# Patient Record
Sex: Female | Born: 1955 | Race: White | Hispanic: No
Health system: Southern US, Community
[De-identification: ages and names within clinical notes are randomized; demographics above are authoritative.]

---

## 2006-07-12 ENCOUNTER — Encounter: Payer: Self-pay | Admitting: Family Medicine

## 2007-02-18 LAB — CONVERTED CEMR LAB: Pap Smear: NORMAL

## 2007-08-01 ENCOUNTER — Encounter: Payer: Self-pay | Admitting: Family Medicine

## 2007-11-27 ENCOUNTER — Emergency Department (HOSPITAL_COMMUNITY): Admission: EM | Admit: 2007-11-27 | Discharge: 2007-11-27 | Payer: Self-pay | Admitting: Family Medicine

## 2008-01-23 ENCOUNTER — Encounter: Payer: Self-pay | Admitting: Family Medicine

## 2008-02-07 ENCOUNTER — Ambulatory Visit: Payer: Self-pay | Admitting: Family Medicine

## 2008-02-07 DIAGNOSIS — G43009 Migraine without aura, not intractable, without status migrainosus: Secondary | ICD-10-CM | POA: Insufficient documentation

## 2008-02-07 DIAGNOSIS — I10 Essential (primary) hypertension: Secondary | ICD-10-CM | POA: Insufficient documentation

## 2008-02-07 DIAGNOSIS — G4700 Insomnia, unspecified: Secondary | ICD-10-CM | POA: Insufficient documentation

## 2008-02-07 DIAGNOSIS — M79609 Pain in unspecified limb: Secondary | ICD-10-CM | POA: Insufficient documentation

## 2008-02-07 DIAGNOSIS — M5030 Other cervical disc degeneration, unspecified cervical region: Secondary | ICD-10-CM

## 2008-02-07 DIAGNOSIS — I2510 Atherosclerotic heart disease of native coronary artery without angina pectoris: Secondary | ICD-10-CM | POA: Insufficient documentation

## 2008-02-07 DIAGNOSIS — K279 Peptic ulcer, site unspecified, unspecified as acute or chronic, without hemorrhage or perforation: Secondary | ICD-10-CM | POA: Insufficient documentation

## 2008-02-07 DIAGNOSIS — J4489 Other specified chronic obstructive pulmonary disease: Secondary | ICD-10-CM | POA: Insufficient documentation

## 2008-02-07 DIAGNOSIS — M546 Pain in thoracic spine: Secondary | ICD-10-CM

## 2008-02-07 DIAGNOSIS — F411 Generalized anxiety disorder: Secondary | ICD-10-CM | POA: Insufficient documentation

## 2008-02-07 DIAGNOSIS — E785 Hyperlipidemia, unspecified: Secondary | ICD-10-CM

## 2008-02-07 DIAGNOSIS — Z87442 Personal history of urinary calculi: Secondary | ICD-10-CM

## 2008-02-07 DIAGNOSIS — J309 Allergic rhinitis, unspecified: Secondary | ICD-10-CM | POA: Insufficient documentation

## 2008-02-07 DIAGNOSIS — J449 Chronic obstructive pulmonary disease, unspecified: Secondary | ICD-10-CM

## 2008-02-09 ENCOUNTER — Encounter: Payer: Self-pay | Admitting: Family Medicine

## 2008-02-21 ENCOUNTER — Telehealth: Payer: Self-pay | Admitting: Family Medicine

## 2008-03-20 LAB — CONVERTED CEMR LAB: Pap Smear: NORMAL

## 2008-03-25 ENCOUNTER — Telehealth: Payer: Self-pay | Admitting: Family Medicine

## 2008-03-31 ENCOUNTER — Other Ambulatory Visit: Admission: RE | Admit: 2008-03-31 | Discharge: 2008-03-31 | Payer: Self-pay | Admitting: Family Medicine

## 2008-04-01 ENCOUNTER — Encounter: Payer: Self-pay | Admitting: Family Medicine

## 2008-04-01 ENCOUNTER — Ambulatory Visit: Payer: Self-pay | Admitting: Family Medicine

## 2008-04-09 LAB — CONVERTED CEMR LAB
ALT: 12 units/L (ref 0–35)
AST: 20 units/L (ref 0–37)
Albumin: 4 g/dL (ref 3.5–5.2)
Alkaline Phosphatase: 109 units/L (ref 39–117)
BUN: 11 mg/dL (ref 6–23)
Bilirubin, Direct: 0.1 mg/dL (ref 0.0–0.3)
CO2: 25 meq/L (ref 19–32)
Calcium: 9.6 mg/dL (ref 8.4–10.5)
Chloride: 106 meq/L (ref 96–112)
Cholesterol: 170 mg/dL (ref 0–200)
Creatinine, Ser: 0.8 mg/dL (ref 0.4–1.2)
GFR calc Af Amer: 97 mL/min
GFR calc non Af Amer: 80 mL/min
Glucose, Bld: 63 mg/dL — ABNORMAL LOW (ref 70–99)
HDL: 79.3 mg/dL (ref >39.0)
LDL Cholesterol: 75 mg/dL (ref 0–99)
Potassium: 4.2 meq/L (ref 3.5–5.1)
Sodium: 138 meq/L (ref 135–145)
Total Bilirubin: 0.7 mg/dL (ref 0.3–1.2)
Total CHOL/HDL Ratio: 2.1
Total Protein: 7 g/dL (ref 6.0–8.3)
Triglycerides: 78 mg/dL (ref 0–149)
VLDL: 16 mg/dL (ref 0–40)

## 2008-04-23 ENCOUNTER — Telehealth: Payer: Self-pay | Admitting: Family Medicine

## 2008-04-30 ENCOUNTER — Encounter: Payer: Self-pay | Admitting: Family Medicine

## 2008-05-14 ENCOUNTER — Ambulatory Visit: Payer: Self-pay | Admitting: Family Medicine

## 2008-05-14 DIAGNOSIS — M810 Age-related osteoporosis without current pathological fracture: Secondary | ICD-10-CM | POA: Insufficient documentation

## 2008-05-16 LAB — CONVERTED CEMR LAB: Vit D, 1,25-Dihydroxy: 53 (ref 30–89)

## 2008-06-17 ENCOUNTER — Ambulatory Visit: Payer: Self-pay | Admitting: Family Medicine

## 2008-06-26 ENCOUNTER — Telehealth: Payer: Self-pay | Admitting: Family Medicine

## 2008-07-16 ENCOUNTER — Telehealth: Payer: Self-pay | Admitting: Family Medicine

## 2008-07-29 ENCOUNTER — Telehealth: Payer: Self-pay | Admitting: Family Medicine

## 2008-08-22 ENCOUNTER — Ambulatory Visit: Payer: Self-pay | Admitting: Family Medicine

## 2008-08-22 DIAGNOSIS — J441 Chronic obstructive pulmonary disease with (acute) exacerbation: Secondary | ICD-10-CM

## 2008-08-29 ENCOUNTER — Ambulatory Visit: Payer: Self-pay | Admitting: Family Medicine

## 2008-08-30 ENCOUNTER — Telehealth: Payer: Self-pay | Admitting: Family Medicine

## 2008-09-11 ENCOUNTER — Telehealth: Payer: Self-pay | Admitting: Family Medicine

## 2008-09-30 ENCOUNTER — Ambulatory Visit: Payer: Self-pay | Admitting: Family Medicine

## 2008-10-04 ENCOUNTER — Telehealth (INDEPENDENT_AMBULATORY_CARE_PROVIDER_SITE_OTHER): Payer: Self-pay | Admitting: *Deleted

## 2008-10-21 ENCOUNTER — Telehealth: Payer: Self-pay | Admitting: Family Medicine

## 2008-10-31 ENCOUNTER — Telehealth: Payer: Self-pay | Admitting: Family Medicine

## 2008-12-03 ENCOUNTER — Telehealth: Payer: Self-pay | Admitting: Family Medicine

## 2009-01-02 ENCOUNTER — Telehealth: Payer: Self-pay | Admitting: Family Medicine

## 2009-01-03 ENCOUNTER — Telehealth: Payer: Self-pay | Admitting: Family Medicine

## 2009-01-09 ENCOUNTER — Telehealth: Payer: Self-pay | Admitting: Family Medicine

## 2009-01-27 ENCOUNTER — Telehealth: Payer: Self-pay | Admitting: Family Medicine

## 2009-02-03 ENCOUNTER — Telehealth: Payer: Self-pay | Admitting: Family Medicine

## 2009-02-24 ENCOUNTER — Telehealth: Payer: Self-pay | Admitting: Family Medicine

## 2009-02-26 ENCOUNTER — Encounter: Payer: Self-pay | Admitting: Family Medicine

## 2009-03-04 ENCOUNTER — Telehealth: Payer: Self-pay | Admitting: Family Medicine

## 2009-04-03 ENCOUNTER — Telehealth (INDEPENDENT_AMBULATORY_CARE_PROVIDER_SITE_OTHER): Payer: Self-pay | Admitting: *Deleted

## 2009-04-15 ENCOUNTER — Ambulatory Visit: Payer: Self-pay | Admitting: Family Medicine

## 2009-04-15 DIAGNOSIS — G43909 Migraine, unspecified, not intractable, without status migrainosus: Secondary | ICD-10-CM | POA: Insufficient documentation

## 2009-05-07 ENCOUNTER — Telehealth: Payer: Self-pay | Admitting: Family Medicine

## 2009-05-09 ENCOUNTER — Ambulatory Visit: Payer: Self-pay | Admitting: Family Medicine

## 2009-05-09 ENCOUNTER — Encounter (INDEPENDENT_AMBULATORY_CARE_PROVIDER_SITE_OTHER): Payer: Self-pay | Admitting: *Deleted

## 2009-05-12 ENCOUNTER — Ambulatory Visit: Payer: Self-pay | Admitting: Family Medicine

## 2009-05-12 ENCOUNTER — Telehealth: Payer: Self-pay | Admitting: Family Medicine

## 2009-05-12 LAB — CONVERTED CEMR LAB
Cholesterol, target level: 200 mg/dL
HDL goal, serum: 40 mg/dL
LDL Goal: 70 mg/dL

## 2009-05-15 ENCOUNTER — Ambulatory Visit: Payer: Self-pay | Admitting: Family Medicine

## 2009-05-20 ENCOUNTER — Ambulatory Visit: Payer: Self-pay | Admitting: Family Medicine

## 2009-05-20 DIAGNOSIS — E739 Lactose intolerance, unspecified: Secondary | ICD-10-CM

## 2009-05-20 DIAGNOSIS — K219 Gastro-esophageal reflux disease without esophagitis: Secondary | ICD-10-CM

## 2009-05-20 LAB — CONVERTED CEMR LAB
ALT: 22 units/L (ref 0–35)
AST: 33 units/L (ref 0–37)
Albumin: 3.8 g/dL (ref 3.5–5.2)
BUN: 16 mg/dL (ref 6–23)
CO2: 27 meq/L (ref 19–32)
Chloride: 105 meq/L (ref 96–112)
Glucose, Bld: 142 mg/dL — ABNORMAL HIGH (ref 70–99)
Potassium: 3.8 meq/L (ref 3.5–5.1)

## 2009-05-21 LAB — CONVERTED CEMR LAB: Cholesterol: 158 mg/dL (ref 0–200)

## 2009-05-27 ENCOUNTER — Observation Stay (HOSPITAL_COMMUNITY): Admission: EM | Admit: 2009-05-27 | Discharge: 2009-05-29 | Payer: Self-pay | Admitting: Emergency Medicine

## 2009-05-27 ENCOUNTER — Ambulatory Visit: Payer: Self-pay | Admitting: Family Medicine

## 2009-05-27 DIAGNOSIS — J189 Pneumonia, unspecified organism: Secondary | ICD-10-CM

## 2009-05-29 ENCOUNTER — Encounter (INDEPENDENT_AMBULATORY_CARE_PROVIDER_SITE_OTHER): Payer: Self-pay | Admitting: *Deleted

## 2009-05-29 ENCOUNTER — Telehealth: Payer: Self-pay | Admitting: Family Medicine

## 2009-06-03 ENCOUNTER — Ambulatory Visit: Payer: Self-pay | Admitting: Family Medicine

## 2009-06-03 ENCOUNTER — Encounter (INDEPENDENT_AMBULATORY_CARE_PROVIDER_SITE_OTHER): Payer: Self-pay | Admitting: *Deleted

## 2009-06-10 ENCOUNTER — Encounter: Payer: Self-pay | Admitting: Family Medicine

## 2009-06-18 ENCOUNTER — Ambulatory Visit: Payer: Self-pay | Admitting: Family Medicine

## 2009-06-18 LAB — CONVERTED CEMR LAB
BUN: 14 mg/dL (ref 6–23)
Chloride: 108 meq/L (ref 96–112)
Glucose, Bld: 106 mg/dL — ABNORMAL HIGH (ref 70–99)
Hgb A1c MFr Bld: 5.9 % (ref 4.6–6.5)
Microalb Creat Ratio: 25.4 mg/g (ref 0.0–30.0)
Microalb, Ur: 0.3 mg/dL (ref 0.0–1.9)
Potassium: 4.4 meq/L (ref 3.5–5.1)

## 2009-06-25 ENCOUNTER — Ambulatory Visit: Payer: Self-pay | Admitting: Family Medicine

## 2009-06-30 ENCOUNTER — Telehealth: Payer: Self-pay | Admitting: Family Medicine

## 2009-07-10 ENCOUNTER — Telehealth: Payer: Self-pay | Admitting: Family Medicine

## 2009-07-31 ENCOUNTER — Telehealth: Payer: Self-pay | Admitting: Family Medicine

## 2009-08-08 ENCOUNTER — Telehealth: Payer: Self-pay | Admitting: Family Medicine

## 2009-09-25 ENCOUNTER — Telehealth: Payer: Self-pay | Admitting: Family Medicine

## 2009-10-30 ENCOUNTER — Ambulatory Visit: Payer: Self-pay | Admitting: Family Medicine

## 2009-10-30 DIAGNOSIS — M545 Low back pain: Secondary | ICD-10-CM

## 2009-11-11 ENCOUNTER — Telehealth: Payer: Self-pay | Admitting: Family Medicine

## 2009-11-27 ENCOUNTER — Encounter: Payer: Self-pay | Admitting: Family Medicine

## 2009-11-27 ENCOUNTER — Ambulatory Visit: Payer: Self-pay | Admitting: Pain Medicine

## 2009-12-03 ENCOUNTER — Encounter: Payer: Self-pay | Admitting: Family Medicine

## 2009-12-03 ENCOUNTER — Ambulatory Visit: Payer: Self-pay | Admitting: Pain Medicine

## 2009-12-31 ENCOUNTER — Telehealth: Payer: Self-pay | Admitting: Family Medicine

## 2010-01-06 ENCOUNTER — Ambulatory Visit: Payer: Self-pay | Admitting: Pain Medicine

## 2010-01-06 ENCOUNTER — Encounter: Payer: Self-pay | Admitting: Family Medicine

## 2010-01-12 ENCOUNTER — Ambulatory Visit: Payer: Self-pay | Admitting: Pain Medicine

## 2010-01-12 ENCOUNTER — Encounter: Payer: Self-pay | Admitting: Family Medicine

## 2010-01-13 ENCOUNTER — Ambulatory Visit: Payer: Self-pay | Admitting: Pain Medicine

## 2010-02-03 ENCOUNTER — Telehealth: Payer: Self-pay | Admitting: Family Medicine

## 2010-02-12 ENCOUNTER — Telehealth: Payer: Self-pay | Admitting: Family Medicine

## 2010-02-12 ENCOUNTER — Encounter: Payer: Self-pay | Admitting: Family Medicine

## 2010-02-12 ENCOUNTER — Ambulatory Visit: Payer: Self-pay | Admitting: Pain Medicine

## 2010-02-17 ENCOUNTER — Ambulatory Visit: Payer: Self-pay | Admitting: Family Medicine

## 2010-02-17 DIAGNOSIS — K5909 Other constipation: Secondary | ICD-10-CM

## 2010-02-18 ENCOUNTER — Ambulatory Visit: Payer: Self-pay | Admitting: Pain Medicine

## 2010-02-18 ENCOUNTER — Encounter: Payer: Self-pay | Admitting: Family Medicine

## 2010-03-18 ENCOUNTER — Encounter: Payer: Self-pay | Admitting: Family Medicine

## 2010-03-18 ENCOUNTER — Ambulatory Visit: Payer: Self-pay | Admitting: Pain Medicine

## 2010-03-24 ENCOUNTER — Ambulatory Visit: Payer: Self-pay | Admitting: Family Medicine

## 2010-03-25 IMAGING — CR DG CHEST 2V
2 series · 2 of 2 positions shown · non-contrast
Comparison: None

CLINICAL DATA: Cough, pain

CHEST - 2 VIEW

[w chest pa]
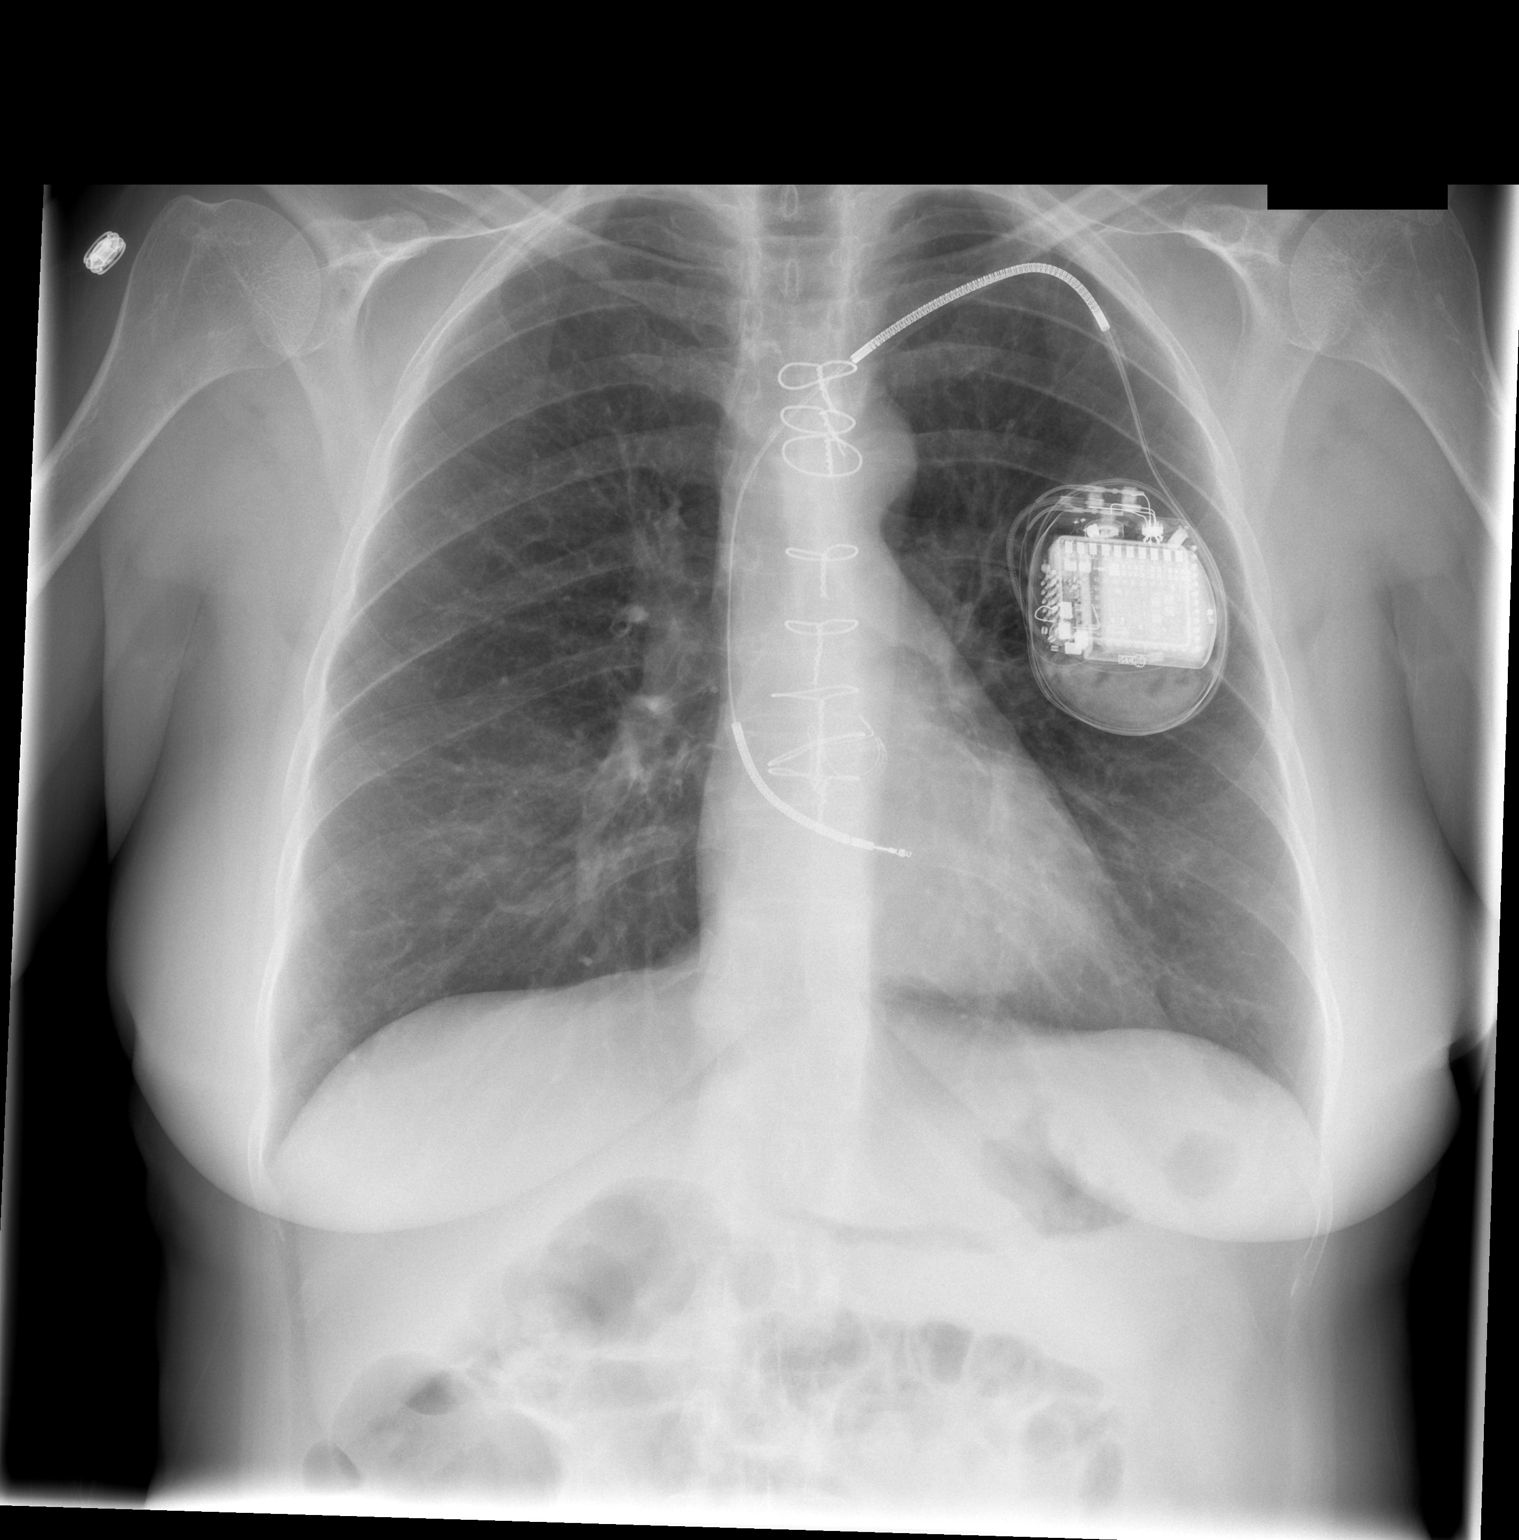

[w chest lat]
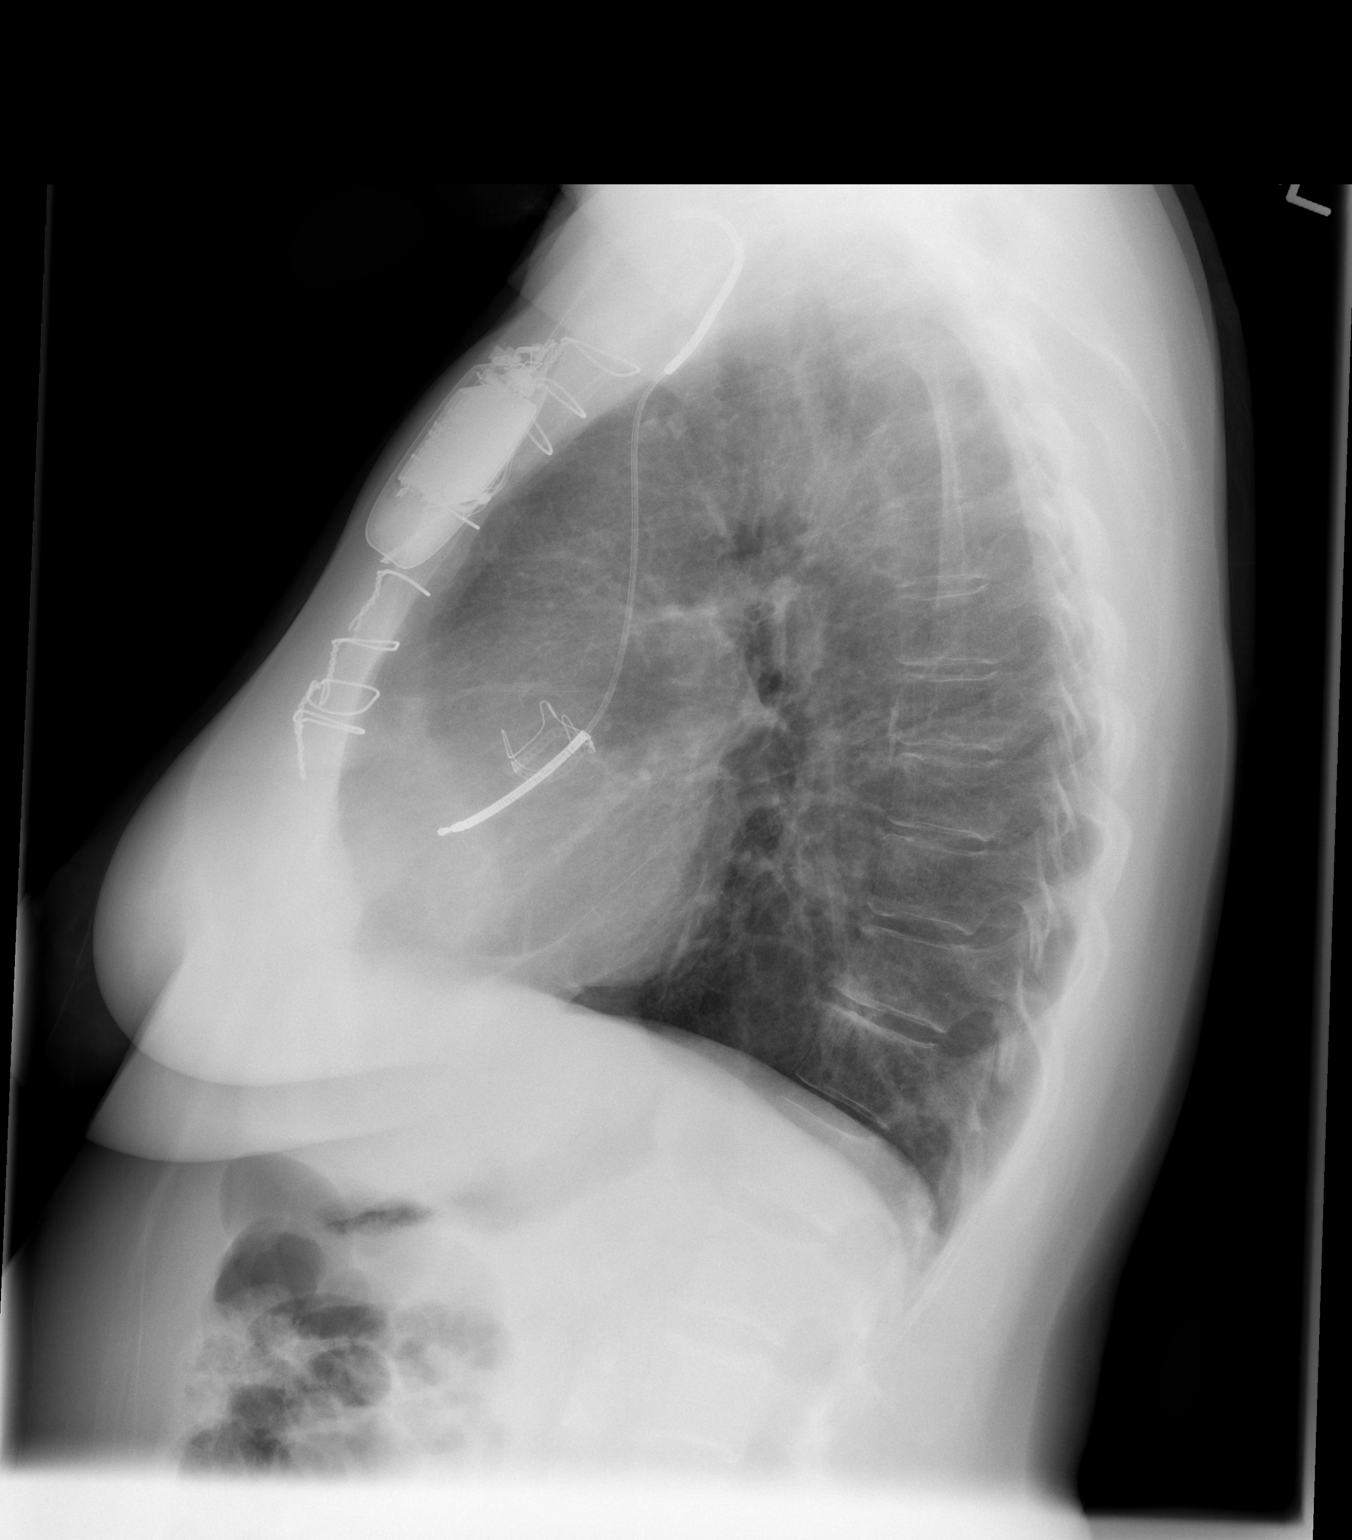

[2 of 2 positions shown; findings below may reference images not displayed]

FINDINGS: The cardiac silhouette, mediastinum, pulmonary
vasculature are within normal limits.  Both lungs are clear.  The
single pacemaker lead  overlies the anterior mid heart.  An aortic
valve prosthesis is present.  The visualized upper abdomen and
osseous structures are unremarkable.
IMPRESSION: There is no evidence of acute cardiac or pulmonary process.

## 2010-04-01 ENCOUNTER — Encounter: Payer: Self-pay | Admitting: Family Medicine

## 2010-04-01 ENCOUNTER — Ambulatory Visit: Payer: Self-pay | Admitting: Pain Medicine

## 2010-04-08 ENCOUNTER — Telehealth: Payer: Self-pay | Admitting: Family Medicine

## 2010-04-22 ENCOUNTER — Telehealth: Payer: Self-pay | Admitting: Family Medicine

## 2010-04-28 ENCOUNTER — Encounter: Payer: Self-pay | Admitting: Family Medicine

## 2010-04-28 ENCOUNTER — Ambulatory Visit: Payer: Self-pay | Admitting: Pain Medicine

## 2010-05-26 ENCOUNTER — Encounter: Payer: Self-pay | Admitting: Family Medicine

## 2010-05-26 ENCOUNTER — Ambulatory Visit: Payer: Self-pay | Admitting: Pain Medicine

## 2010-05-26 ENCOUNTER — Telehealth (INDEPENDENT_AMBULATORY_CARE_PROVIDER_SITE_OTHER): Payer: Self-pay | Admitting: *Deleted

## 2010-05-27 ENCOUNTER — Ambulatory Visit: Payer: Self-pay | Admitting: Family Medicine

## 2010-05-27 DIAGNOSIS — R5381 Other malaise: Secondary | ICD-10-CM

## 2010-05-27 DIAGNOSIS — R5383 Other fatigue: Secondary | ICD-10-CM

## 2010-05-27 LAB — CONVERTED CEMR LAB
AST: 25 units/L (ref 0–37)
Albumin: 3.8 g/dL (ref 3.5–5.2)
BUN: 8 mg/dL (ref 6–23)
Basophils Absolute: 0.1 10*3/uL (ref 0.0–0.1)
CO2: 31 meq/L (ref 19–32)
Cholesterol: 180 mg/dL (ref 0–200)
Eosinophils Relative: 1.6 % (ref 0.0–5.0)
GFR calc non Af Amer: 71.09 mL/min (ref >60)
Glucose, Bld: 99 mg/dL (ref 70–99)
HCT: 41.9 % (ref 36.0–46.0)
HDL: 77.9 mg/dL (ref >39.00)
Lymphocytes Relative: 30.6 % (ref 12.0–46.0)
Monocytes Relative: 8.7 % (ref 3.0–12.0)
Neutrophils Relative %: 58.4 % (ref 43.0–77.0)
Platelets: 353 10*3/uL (ref 150.0–400.0)
Potassium: 4.6 meq/L (ref 3.5–5.1)
RDW: 15.2 % — ABNORMAL HIGH (ref 11.5–14.6)
Total Protein: 6.2 g/dL (ref 6.0–8.3)
VLDL: 22.6 mg/dL (ref 0.0–40.0)
WBC: 8.6 10*3/uL (ref 4.5–10.5)

## 2010-06-01 ENCOUNTER — Ambulatory Visit: Payer: Self-pay | Admitting: Pain Medicine

## 2010-06-01 ENCOUNTER — Encounter: Payer: Self-pay | Admitting: Family Medicine

## 2010-06-03 ENCOUNTER — Ambulatory Visit: Payer: Self-pay | Admitting: Family Medicine

## 2010-06-03 ENCOUNTER — Other Ambulatory Visit
Admission: RE | Admit: 2010-06-03 | Discharge: 2010-06-03 | Payer: Self-pay | Source: Home / Self Care | Admitting: Family Medicine

## 2010-06-10 ENCOUNTER — Encounter: Payer: Self-pay | Admitting: Family Medicine

## 2010-06-11 LAB — CONVERTED CEMR LAB: Pap Smear: NEGATIVE

## 2010-06-12 ENCOUNTER — Encounter (INDEPENDENT_AMBULATORY_CARE_PROVIDER_SITE_OTHER): Payer: Self-pay | Admitting: *Deleted

## 2010-08-04 ENCOUNTER — Ambulatory Visit: Payer: Self-pay | Admitting: Family Medicine

## 2010-09-08 ENCOUNTER — Ambulatory Visit: Payer: Self-pay | Admitting: Family Medicine

## 2010-09-11 LAB — CONVERTED CEMR LAB
Cholesterol: 198 mg/dL (ref 0–200)
HDL: 78.5 mg/dL (ref >39.00)
Triglycerides: 91 mg/dL (ref 0.0–149.0)
VLDL: 18.2 mg/dL (ref 0.0–40.0)

## 2010-09-15 ENCOUNTER — Ambulatory Visit: Payer: Self-pay | Admitting: Family Medicine

## 2010-09-17 ENCOUNTER — Telehealth: Payer: Self-pay | Admitting: Family Medicine

## 2010-09-18 ENCOUNTER — Telehealth: Payer: Self-pay | Admitting: Family Medicine

## 2010-09-18 ENCOUNTER — Ambulatory Visit: Payer: Self-pay | Admitting: Family Medicine

## 2010-09-18 DIAGNOSIS — L259 Unspecified contact dermatitis, unspecified cause: Secondary | ICD-10-CM

## 2010-10-22 ENCOUNTER — Ambulatory Visit: Payer: Self-pay | Admitting: Family Medicine

## 2010-10-27 ENCOUNTER — Telehealth: Payer: Self-pay | Admitting: Family Medicine

## 2010-12-08 ENCOUNTER — Inpatient Hospital Stay (HOSPITAL_COMMUNITY)
Admission: EM | Admit: 2010-12-08 | Discharge: 2010-12-09 | Payer: Self-pay | Source: Home / Self Care | Attending: Cardiology | Admitting: Cardiology

## 2010-12-23 ENCOUNTER — Other Ambulatory Visit: Payer: Self-pay | Admitting: Cardiology

## 2010-12-23 ENCOUNTER — Ambulatory Visit (HOSPITAL_COMMUNITY)
Admission: RE | Admit: 2010-12-23 | Discharge: 2010-12-23 | Payer: Self-pay | Source: Home / Self Care | Attending: Cardiovascular Disease | Admitting: Cardiovascular Disease

## 2010-12-23 DIAGNOSIS — I724 Aneurysm of artery of lower extremity: Secondary | ICD-10-CM
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
## 2011-01-19 NOTE — Letter (Signed)
Summary: Dr[facility]Gregory [facility] Evaluation,Note  Dr[facility]Gregory [facility] Evaluation,Note   Imported By: Beau Fanny 01/15/2010 10:30:29  _____________________________________________________________________  External Attachment:    Type:   Image     Comment:   External Document

## 2011-01-19 NOTE — Progress Notes (Signed)
Summary: pt needs to go off plavix  Phone Note Call from Patient Call back at Home Phone [phone]   Caller: Patient Call For: Kerby Nora MD Summary of Call: Pt is having an epidural injection in her back on 02/18/10- Dr.Crisp is doing this.  He will need to know if it's alright for pt to be off of her plaxix for three days prior.  Dr. Metta Clines will be calling but he told the pt to call as well.  His number is [phone]. Initial call taken by: Lowella Petties CMA,  February 12, 2010 4:14 PM  Follow-up for Phone Call        Okay to go off plavix for three days prior..please let Dr. Metta Clines know.  Follow-up by: Kerby Nora MD,  February 13, 2010 8:31 AM  Additional Follow-up for Phone Call Additional follow up Details #1::        Provider Notified Additional Follow-up by: Benny Lennert CMA Duncan Dull),  February 13, 2010 12:34 PM

## 2011-01-19 NOTE — Letter (Signed)
Summary: Results Follow up Letter  [facility]ia [facility] at [facility]  [address] [place], [place] [address]   Phone: [phone]  Fax: [phone]    06/12/2010 MRN: [id number]     [facility] [place] [address] [place], [place]  [address]    Dear Ms. [name],  The following are the results of your recent test(s):  Test         Result    Pap Smear:        Normal __x___  Not Normal _____ Comments:Repeat in one year ( there was some yeast seen in pap so let us know if you are having any symptoms.) ______________________________________________________ Cholesterol: LDL(Bad cholesterol):         Your goal is less than:         HDL (Good cholesterol):       Your goal is more than: Comments:  ______________________________________________________ Mammogram:        Normal _____  Not Normal _____ Comments:  ___________________________________________________________________ Hemoccult:        Normal _____  Not normal _______ Comments:    _____________________________________________________________________ Other Tests:    We routinely do not discuss normal results over the telephone.  If you desire a copy of the results, or you have any questions about this information we can discuss them at your next office visit.   Sincerely,  Kerby Nora MD

## 2011-01-19 NOTE — Letter (Signed)
Summary: [facility]  [facility]   Imported By: Maryln Gottron 03/25/2010 14:38:17  _____________________________________________________________________  External Attachment:    Type:   Image     Comment:   External Document

## 2011-01-19 NOTE — Assessment & Plan Note (Signed)
Summary: BEDSOLE FLU SHOT/[facility]  Nurse Visit   Allergies: 1)  ! Aspirin 2)  ! Penicillin 3)  ! Codeine  Immunizations Administered:  Influenza Vaccine # 1:    Vaccine Type: Fluvax 3+    Site: left deltoid    Mfr: [employer]oSmith[employer]    Dose: 0.5 ml    Route: IM    Given by: Mervin Hack CMA (AAMA)    Exp. Date: 06/19/2011    Lot #: [id number]    VIS given: 07/14/10 version given September 15, 2010.  Flu Vaccine Consent Questions:    Do you have a history of severe allergic reactions to this vaccine? no    Any prior history of allergic reactions to egg and/or gelatin? no    Do you have a sensitivity to the preservative Thimersol? no    Do you have a past history of Guillan-Barre Syndrome? no    Do you currently have an acute febrile illness? no    Have you ever had a severe reaction to latex? no    Vaccine information given and explained to patient? yes    Are you currently pregnant? no  Orders Added: 1)  Flu Vaccine 53yrs + [90658] 2)  Admin 1st Vaccine [09811]

## 2011-01-19 NOTE — Letter (Signed)
Summary: [facility]  [facility]   Imported By: Lanelle Bal 01/13/2010 13:11:01  _____________________________________________________________________  External Attachment:    Type:   Image     Comment:   External Document
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
## 2011-01-19 NOTE — Progress Notes (Signed)
----   Converted from flag ---- ---- 05/26/2010 8:33 AM, Kerby Nora MD wrote: Dx 272.0, LIPIDs, CMET, cbc Dx 780.79   ---- 05/26/2010 8:11 AM, Liane Comber CMA (AAMA) wrote: Pt is scheduled for cpx labs tomorrow, what labs to draw and dx codes? Thanks Tasha ------------------------------

## 2011-01-19 NOTE — Progress Notes (Signed)
Summary: refill request for amitriptyline  Phone Note Refill Request Message from:  Fax from Pharmacy  Refills Requested: Medication #1:  AMITRIPTYLINE HCL 25 MG TABS 1 TAB by mouth at bedtime FOR MIGRAINE PROPHYLAXISIS   Last Refilled: 01/06/2010 Faxed request from [facility] [address], phone [phone].  Initial call taken by: Lowella Petties CMA,  February 03, 2010 8:56 AM  Follow-up for Phone Call        Call pt..we have down that she no longer uses this.  if using.Marland Kitchenokay to fill for 6 months.  Follow-up by: Kerby Nora MD,  February 03, 2010 9:29 AM  Additional Follow-up for Phone Call Additional follow up Details #1::        rx called to pham Additional Follow-up by: Benny Lennert CMA Duncan Dull),  February 03, 2010 11:34 AM

## 2011-01-19 NOTE — Progress Notes (Signed)
Summary: xanax  Phone Note Refill Request Message from:  Patient on October 27, 2010 2:46 PM  Refills Requested: Medication #1:  ALPRAZOLAM 0.5 MG TABS 1 tab by mouth daily as needed panic attacks   Supply Requested: 1 month [facility] [address]   Method Requested: Telephone to Pharmacy Initial call taken by: Benny Lennert CMA Duncan Dull),  October 27, 2010 2:46 PM  Follow-up for Phone Call        Rx called to pharmacy Follow-up by: Linde Gillis CMA [facility] Dull),  October 27, 2010 3:23 PM    Prescriptions: ALPRAZOLAM 0.5 MG TABS (ALPRAZOLAM) 1 tab by mouth daily as needed panic attacks  #30 x 0   Entered and Authorized by:   Kerby Nora MD   Signed by:   Kerby Nora MD on 10/27/2010   Method used:   Telephoned to ...       [facility] [address] (retail)       [address], [address]       [place], [place]  [address]       Ph: [phone]       Fax: [phone]   RxID:   [id number]

## 2011-01-19 NOTE — Letter (Signed)
Summary: [facility]  [facility]   Imported By: Lanelle Bal 02/24/2010 11:02:59  _____________________________________________________________________  External Attachment:    Type:   Image     Comment:   External Document

## 2011-01-19 NOTE — Progress Notes (Signed)
Summary: reaction to flu vaccine  Phone Note Call from Patient Call back at Home Phone [phone]   Caller: Patient Summary of Call: Pt was given a flu shot on tuesday and today the area is very swollen, tender and hot.  I advised her to take tylenol, put ice on it tonight and if not better tomorrow to call back for instructions. Initial call taken by: Lowella Petties CMA,  September 17, 2010 3:21 PM  Follow-up for Phone Call        agree Hannah Beat MD  September 17, 2010 3:39 PM
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
## 2011-01-19 NOTE — Letter (Signed)
Summary: [facility]  [facility]   Imported By: Lanelle Bal 02/18/2010 10:38:51  _____________________________________________________________________  External Attachment:    Type:   Image     Comment:   External Document

## 2011-01-19 NOTE — Letter (Signed)
Summary: [facility]  [facility]   Imported By: Lanelle Bal 04/07/2010 13:17:31  _____________________________________________________________________  External Attachment:    Type:   Image     Comment:   External Document

## 2011-01-19 NOTE — Assessment & Plan Note (Signed)
Summary: COUGH,CONGESTION,WHEEZING/CLE   Vital Signs:  Patient profile:   55 year old female Height:      61 inches Weight:      139.75 pounds BMI:     26.50 O2 Sat:      98 % on Room air Temp:     98.1 degrees F oral Pulse rate:   84 / minute Pulse rhythm:   regular BP sitting:   160 / 90  (left arm) Cuff size:   regular  Vitals Entered By: Delilah Shan CMA Duncan Dull) (October 22, 2010 3:55 PM)  O2 Flow:  Room air CC: Cough, congestion, wheezing   History of Present Illness: Started Friday.  Using nebs and taking robitussen.  Ran out of advair and is off spiriva, neither made a difference per patient.  Wheezeing more.  Nebs help some.  Smoking a few cigs a day.  Had prev cut down.  Has been using electric cigarette.  Inc in sputum.  discolored.  Coughing more at night. +fevers.  Prev on prednisone with some relief.    Allergies: 1)  ! Aspirin 2)  ! Penicillin 3)  ! Codeine  Review of Systems       See HPI.  Otherwise negative.    Physical Exam  General:  GEN: nad, alert and oriented HEENT: mucous membranes moist, TM w/o erythema, nasal epithelium injected, OP with cobblestoning, no exudates NECK: supple w/o LA CV: rrr. PULM:  no inc wob but prolonged exp phase and diffuse ronchi/wheeze ABD: soft, +bs EXT: no edema    Impression & Recommendations:  Problem # 1:  CHRONIC OBSTRUCTIVE PULMONARY DISEASE, ACUTE EXACERBATION (ICD-491.21) Start steroids, antibiotics and continue SABA.  Return for follow up with PMD re: other longterm agents.  No sig change per patient with advair or spiriva. She understands. D/w patient re: stopping tobacco.   Complete Medication List: 1)  Plavix 75 Mg Tabs (Clopidogrel bisulfate) .... Take 1 tablet by mouth once a day 2)  Crestor 20 Mg Tabs (Rosuvastatin calcium) .... Take 1 tablet by mouth once a day 3)  Cartia Xt 300 Mg Xr24h-cap (Diltiazem hcl coated beads) .Marland Kitchen.. 1 tab by mouth daily 4)  Alprazolam 0.5 Mg Tabs (Alprazolam) .Marland Kitchen.. 1 tab  by mouth daily as needed panic attacks 5)  Alendronate Sodium 70 Mg Tabs (Alendronate sodium) .Marland Kitchen.. 1 tab by mouth each week 6)  Triamcinolone Acetonide 0.1 % Crea (Triamcinolone acetonide) .... Aaa two times a day x 2 weeks as needed 7)  Albuterol Sulfate (2.5 Mg/36ml) 0.083% Nebu (Albuterol sulfate) .... For use in nebulizer 8)  Calcium Carbonate-vitamin D 600-400 Mg-unit Tabs (Calcium carbonate-vitamin d) .... Take 1 tablet by mouth two times a day 9)  Amitriptyline Hcl 25 Mg Tabs (Amitriptyline hcl) .Marland Kitchen.. 1 tab by mouth at bedtime for migraine prophylaxisis 10)  Sumatriptan Succinate 50 Mg Tabs (Sumatriptan succinate) .Marland Kitchen.. 1 by mouth as needed headache, may repeat in 2 hours 11)  Sertraline Hcl 100 Mg Tabs (Sertraline hcl) .Marland Kitchen.. 1 tab by mouth daily 12)  Anamantle Hc 3-0.5 % Crea (Lidocaine-hydrocortisone ace) .... Apply two times a day to rectal canal with applicator 13)  Zithromax 250 Mg Tabs (Azithromycin) .... 2 by mouth today and then 1 by mouth once daily for 4 days 14)  Prednisone 20 Mg Tabs (Prednisone) .... 2 by mouth qday with food x5 days. 15)  Proair Hfa 108 (90 Base) Mcg/act Aers (Albuterol sulfate) .... 2 puffs every 4hours as needed.  Patient Instructions: 1)  I  would start the antibiotics and steroids today.  Take the prednisone with food.  Use the inhaler as needed every 4 hours.  Let us know if you aren't getting better.  I would schedule a follow up appointment with Dr. Ermalene Searing when you are feeling better to talk about inhalers other than advair or spiriva.  Take care.  The sooner you quit smoking, the better this will be.  Prescriptions: PROAIR HFA 108 (90 BASE) MCG/ACT AERS (ALBUTEROL SULFATE) 2 puffs every 4hours as needed.  #1 x 5   Entered and Authorized by:   Crawford Givens MD   Signed by:   Crawford Givens MD on 10/22/2010   Method used:   Electronically to        [facility] [address] (retail)       [address], [address]       [place], [place]  [address]       Ph: [phone]       Fax: [phone]   RxID:   [id number] PREDNISONE 20 MG TABS (PREDNISONE) 2 by mouth qday with food x5 days.  #10 x 0   Entered and Authorized by:   Crawford Givens MD   Signed by:   Crawford Givens MD on 10/22/2010   Method used:   Electronically to        [facility] [address] (retail)       [address], [address]       [place], [place]  [address]       Ph: [phone]       Fax: [phone]   RxID:   [id number] ZITHROMAX 250 MG TABS (AZITHROMYCIN) 2 by mouth today and then 1 by mouth once daily for 4 days  #6 x 0   Entered and Authorized by:   Crawford Givens MD   Signed by:   Crawford Givens MD on 10/22/2010   Method used:   Electronically to        [facility] [address] (retail)       [address], [address]       [place], [place]  [address]       Ph: [phone]       Fax: [phone]   RxID:   [id number]    Orders Added: 1)  Est. Patient Level III [517[address]]    Current Allergies (reviewed today): ! ASPIRIN ! PENICILLIN ! CODEINE

## 2011-01-19 NOTE — Progress Notes (Signed)
Summary: xanax  Phone Note Refill Request Message from:  [employer] on December 31, 2009 12:15 PM  Refills Requested: Medication #1:  ALPRAZOLAM 0.25 MG  TABS Take 1/2 tablet by mouth every 6 hours as needed   Supply Requested: 1 month [facility] [address] [id number]   Method Requested: Telephone to Pharmacy Initial call taken by: Benny Lennert CMA [facility] Dull),  December 31, 2009 12:15 PM  Follow-up for Phone Call        Rx called to pharmacy Follow-up by: Linde Gillis CMA Duncan Dull),  January 01, 2010 12:24 PM    Prescriptions: ALPRAZOLAM 0.25 MG  TABS (ALPRAZOLAM) Take 1/2 tablet by mouth every 6 hours as needed  #30 x 0   Entered and Authorized by:   Kerby Nora MD   Signed by:   Kerby Nora MD on 01/01/2010   Method used:   Telephoned to ...       [facility] [address] ([facility]ail)       [address]       [place], [place]  [address]       Ph: [phone]       Fax: [phone]   RxID:   [id number]
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
## 2011-01-19 NOTE — Letter (Signed)
Summary: [facility]  [facility]   Imported By: Lanelle Bal 05/04/2010 10:51:15  _____________________________________________________________________  External Attachment:    Type:   Image     Comment:   External Document

## 2011-01-19 NOTE — Progress Notes (Signed)
Summary: prior auth needed for crestor  Phone Note From Pharmacy   Caller: [facility]  [id number] [employer] Summary of Call: Prior Berkley Harvey is needed for crestor, form is on your desk. Initial call taken by: Lowella Petties CMA,  September 18, 2010 10:10 AM
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
## 2011-01-19 NOTE — Progress Notes (Signed)
Summary: alprazolam  Phone Note Refill Request Message from:  Scriptline on Apr 22, 2010 2:22 PM  Refills Requested: Medication #1:  ALPRAZOLAM 0.5 MG TABS 1 tab by mouth daily as needed panic attacks [facility] [address] [phone]   Method Requested: Telephone to Pharmacy Initial call taken by: Benny Lennert CMA Duncan Dull),  Apr 22, 2010 2:22 PM  Follow-up for Phone Call        Medication phoned to [facility] [address] pharmacy as instructed. Lewanda Rife LPN  Apr 23, 1609 12:24 PM     Prescriptions: ALPRAZOLAM 0.5 MG TABS (ALPRAZOLAM) 1 tab by mouth daily as needed panic attacks  #30 x 0   Entered and Authorized by:   Kerby Nora MD   Signed by:   Kerby Nora MD on 04/23/2010   Method used:   Telephoned to ...       [facility] [address] (retail)       [address], [address]       [place], [place]  [address]       Ph: [phone]       Fax: [phone]   RxID:   [id number]

## 2011-01-19 NOTE — Letter (Signed)
Summary: Surgical Clearance/[facility]  [facility]   Imported By: Lanelle Bal 06/03/2010 10:24:15  _____________________________________________________________________  External Attachment:    Type:   Image     Comment:   External Document

## 2011-01-19 NOTE — Letter (Signed)
Summary: [facility]  [facility]   Imported By: Lanelle Bal 06/06/2010 09:33:05  _____________________________________________________________________  External Attachment:    Type:   Image     Comment:   External Document

## 2011-01-19 NOTE — Letter (Signed)
Summary: [facility]  [facility]   Imported By: Lanelle Bal 06/02/2010 10:02:09  _____________________________________________________________________  External Attachment:    Type:   Image     Comment:   External Document

## 2011-01-19 NOTE — Letter (Signed)
Summary: Surgical Clearance/[facility]  [facility]   Imported By: Lanelle Bal 03/24/2010 11:36:20  _____________________________________________________________________  External Attachment:    Type:   Image     Comment:   External Document

## 2011-01-19 NOTE — Progress Notes (Signed)
Summary: wants increase in xanax dose  Phone Note Call from Patient Call back at Home Phone [phone]   Caller: Patient Summary of Call: Pt states her current dose of xanax is not strong enough.  She said she had a bad anxiety attack and her BP went to 206/199 (?) while at work a week ago.  She said her BP goes up and down.  She is asking for an increase in her xanax dose.  Uses [facility] [address].  She knows that you are out this week. Initial call taken by: Lowella Petties CMA,  April 08, 2010 3:01 PM  Follow-up for Phone Call        At last OV pt had significant improvement in anxiety on sertraline and ad only used 2 tab in severeal months. is anxiety not as well controlled now?  If having frequent panic attacks ..rec instead increase in sertraline to 100 mg daily . If still rare panic attacks, but xanax low dose not helping when she does have one...she can take 2 tabs at a time. At next refill will make 0.5 mg tabs.  Follow-up by: Kerby Nora MD,  April 08, 2010 11:01 PM  Additional Follow-up for Phone Call Additional follow up Details #1::        [facility] Additional Follow-up by: Benny Lennert CMA Duncan Dull),  April 09, 2010 7:41 AM    Additional Follow-up for Phone Call Additional follow up Details #2::    Patient advised.Consuello Masse CMA  Follow-up by: Benny Lennert CMA Duncan Dull),  April 09, 2010 2:20 PM

## 2011-01-19 NOTE — Letter (Signed)
Summary: Dr[facility]Gregory [facility]  Dr[facility]Gregory [facility]   Imported By: Beau Fanny 02/13/2010 16:40:57  _____________________________________________________________________  External Attachment:    Type:   Image     Comment:   External Document

## 2011-01-19 NOTE — Progress Notes (Signed)
Summary: xanax  Phone Note Refill Request Message from:  Scriptline on September 17, 2010 3:26 PM  Refills Requested: Medication #1:  ALPRAZOLAM 0.5 MG TABS 1 tab by mouth daily as needed panic attacks   Supply Requested: 1 month [facility] [address] [id number]   Method Requested: Telephone to Pharmacy Initial call taken by: Benny Lennert CMA Duncan Dull),  September 17, 2010 3:27 PM  Follow-up for Phone Call        Rx called to pharmacy Follow-up by: Benny Lennert CMA Duncan Dull),  September 18, 2010 9:11 AM    Prescriptions: ALPRAZOLAM 0.5 MG TABS (ALPRAZOLAM) 1 tab by mouth daily as needed panic attacks  #30 x 0   Entered and Authorized by:   Kerby Nora MD   Signed by:   Kerby Nora MD on 09/18/2010   Method used:   Telephoned to ...       [facility] [address] (retail)       [address]       [place], [place]  [address]       Ph: [phone]       Fax: [phone]   RxID:   [id number]

## 2011-03-01 LAB — POCT CARDIAC MARKERS
CKMB, poc: <1 ng/mL — ABNORMAL LOW (ref 1.0–8.0)
Myoglobin, poc: 60.8 ng/mL (ref 12–200)
Troponin i, poc: <0.05 ng/mL (ref 0.00–0.09)

## 2011-03-01 LAB — CK TOTAL AND CKMB (NOT AT ARMC)
CK, MB: 0.8 ng/mL (ref 0.3–4.0)
Relative Index: INVALID (ref 0.0–2.5)
Total CK: 61 U/L (ref 7–177)

## 2011-03-01 LAB — COMPREHENSIVE METABOLIC PANEL
ALT: 16 U/L (ref 0–35)
BUN: 12 mg/dL (ref 6–23)
CO2: 27 mEq/L (ref 19–32)
Calcium: 9.1 mg/dL (ref 8.4–10.5)
Creatinine, Ser: 0.77 mg/dL (ref 0.4–1.2)
GFR calc non Af Amer: >60 mL/min (ref >60)
Glucose, Bld: 99 mg/dL (ref 70–99)
Sodium: 139 mEq/L (ref 135–145)

## 2011-03-01 LAB — CBC
HCT: 36.5 % (ref 36.0–46.0)
Hemoglobin: 12.8 g/dL (ref 12.0–15.0)
MCH: 32.7 pg (ref 26.0–34.0)
MCHC: 35.1 g/dL (ref 30.0–36.0)
MCV: 93.1 fL (ref 78.0–100.0)
Platelets: 265 10*3/uL (ref 150–400)
RBC: 3.92 MIL/uL (ref 3.87–5.11)
RDW: 13.6 % (ref 11.5–15.5)
WBC: 14.3 K/uL — ABNORMAL HIGH (ref 4.0–10.5)

## 2011-03-01 LAB — HEPARIN LEVEL (UNFRACTIONATED)
Heparin Unfractionated: 0.68 IU/mL (ref 0.30–0.70)
Heparin Unfractionated: 1.12 [IU]/mL — ABNORMAL HIGH (ref 0.30–0.70)

## 2011-03-01 LAB — LIPID PANEL
Cholesterol: 144 mg/dL (ref 0–200)
HDL: 60 mg/dL (ref >39)
LDL Cholesterol: 71 mg/dL (ref 0–99)
Triglycerides: 64 mg/dL (ref <150)

## 2011-03-01 LAB — TROPONIN I: Troponin I: 0.02 ng/mL (ref 0.00–0.06)

## 2011-03-01 LAB — HEMOGLOBIN A1C: Mean Plasma Glucose: 128 mg/dL — ABNORMAL HIGH (ref <117)

## 2011-03-01 LAB — BRAIN NATRIURETIC PEPTIDE: Pro B Natriuretic peptide (BNP): 40 pg/mL (ref 0.0–100.0)

## 2011-03-01 LAB — PROTIME-INR: Prothrombin Time: 13.1 seconds (ref 11.6–15.2)

## 2011-03-10 ENCOUNTER — Other Ambulatory Visit: Payer: Self-pay | Admitting: Family Medicine

## 2011-03-29 LAB — COMPREHENSIVE METABOLIC PANEL
Albumin: 3.5 g/dL (ref 3.5–5.2)
Alkaline Phosphatase: 95 U/L (ref 39–117)
BUN: 14 mg/dL (ref 6–23)
Creatinine, Ser: 1.11 mg/dL (ref 0.4–1.2)
Potassium: 4 mEq/L (ref 3.5–5.1)
Total Protein: 6.7 g/dL (ref 6.0–8.3)

## 2011-03-29 LAB — CBC
HCT: 43.4 % (ref 36.0–46.0)
Platelets: 261 10*3/uL (ref 150–400)
RDW: 14.9 % (ref 11.5–15.5)

## 2011-03-29 LAB — POCT I-STAT 3, ART BLOOD GAS (G3+)
Acid-base deficit: 2 mmol/L (ref 0.0–2.0)
Bicarbonate: 21.9 mEq/L (ref 20.0–24.0)
O2 Saturation: 96 %
TCO2: 23 mmol/L (ref 0–100)
pO2, Arterial: 80 mmHg (ref 80.0–100.0)

## 2011-03-29 LAB — DIFFERENTIAL
Lymphocytes Relative: 22 % (ref 12–46)
Monocytes Absolute: 1 10*3/uL (ref 0.1–1.0)
Monocytes Relative: 10 % (ref 3–12)
Neutro Abs: 7.1 10*3/uL (ref 1.7–7.7)

## 2011-04-01 ENCOUNTER — Other Ambulatory Visit: Payer: Self-pay | Admitting: *Deleted

## 2011-04-02 MED ORDER — ALPRAZOLAM ER 0.5 MG PO TB24: ORAL_TABLET | ORAL | Status: DC

## 2011-04-02 MED ORDER — ALPRAZOLAM 0.5 MG PO TABS: 0.5000 mg | ORAL_TABLET | Freq: Every day | ORAL | Status: AC

## 2011-04-02 NOTE — Telephone Encounter (Signed)
I already called this in.  Please alert the patient.

## 2011-04-14 ENCOUNTER — Other Ambulatory Visit: Payer: Self-pay | Admitting: Family Medicine

## 2011-05-04 NOTE — Discharge Summary (Signed)
**Note Olivia-Identified via Obfuscation** [name], [name]                ACCOUNT NO.:  [id number]   MEDICAL RECORD NO.:  [id number]          PATIENT TYPE:  OBV   LOCATION:  5159                         FACILITY:  [facility]   PHYSICIAN:  Gordy Savers, MDDATE OF BIRTH:  1956/10/17   DATE OF ADMISSION:  05/27/2009  DATE OF DISCHARGE:  05/29/2009                               DISCHARGE SUMMARY   FINAL DIAGNOSES:  Acute exacerbation of chronic obstructive pulmonary  disease.   ADDITIONAL DIAGNOSES:  1. Hypertension.  2. Dyslipidemia.  3. Coronary artery disease status post coronary artery bypass graft,      status post aortic valve repair, status post automatic implantable      cardioverter-defibrillator.   DISCHARGE MEDICATIONS:  1. Prednisone taper 5 mg daily, 6-day.  2. Advair 250/50 one puff twice daily.  3. Albuterol home nebulizer treatments every 6 hours as needed for      shortness of breath.  4. Plavix 75 mg daily.  5. Pravastatin 40 mg daily.  6. Cartia XT 300 mg daily.  7. Alprazolam 0.25 one-half tablet every 6 hours as needed for      anxiety.  8. Ambien CR 12.5 mg 1 at bedtime as needed for sleep.  9. Amitriptyline 25 mg at bedtime.   HISTORY OF PRESENT ILLNESS:  The patient is a 55 year old patient who  has a history of COPD with asthmatic component.  She has treated  hypertension and a history of coronary artery disease status post CABG.  She has treated dyslipidemia.  The patient presented to the office of  her primary care Olivia Hawkins on the day of admission complaining of  increasing chest congestion and cough of 2 weeks' duration.  Over the  past 2 days, she had noted increasing shortness of breath and active  wheezing.  The patient was subsequently admitted for further evaluation  and management of decompensated COPD.   LABORATORY DATA AND HOSPITAL COURSE:  The patient was admitted to the  hospital where she was treated with aggressive pulmonary toilet.  She  received albuterol nebulizer  treatments and also parenteral steroids.  On the second hospital day, she continued to have expiratory wheezing,  but was much improved.  She was treated with Lovenox DVT prophylaxis.  She was maintained on statin therapy, Plavix, amitriptyline, diltiazem.  She was treated with Solu-Medrol 40 mg IV every 8 hours.  On the second  hospital day, the patient was much better and had no further wheezing.  She felt that her pulmonary status was back to baseline.  During the  hospital period, she remained afebrile.  She had scanty nonpurulent  mildly productive cough.  Laboratory studies included a white count of  10.6, hemoglobin 14.9, hematocrit 43.4.  Chemistries were normal with  BUN of 14, creatinine 1.11, blood sugar 94.  Liver function studies were  normal.  BNP was normal at 83.  Fibrin derivatives slightly elevated at  1.41.  Chest x-ray revealed pulmonary vascular to be normal.  Both lungs  were clear.  Pacemaker leads noted as well as an aortic valve  prosthesis.  Arterial blood gas revealed  pH of 7.41, pCO2 of 80, pO2 of  34, and an O2 saturation of 96% on room air.  During the hospital  period, she remained afebrile.  Vital signs including blood pressure  were nicely controlled.   DISPOSITION:  The patient was discharged today on her preadmission  regimen.  In addition, she will be discharged on a 6-day 5 mg prednisone  Dosepak.  Cessation of smoking strongly encouraged.  She will follow up  with her primary care Olivia Hawkins within the next week.   CONDITION ON DISCHARGE:  Improved.      Gordy Savers, MD  Electronically Signed     PFK/MEDQ  D:  05/29/2009  T:  05/29/2009  Job:  [id number]

## 2011-06-14 ENCOUNTER — Other Ambulatory Visit: Payer: Self-pay | Admitting: *Deleted

## 2011-06-14 NOTE — Telephone Encounter (Signed)
Opened in error

## 2011-09-27 LAB — POCT URINALYSIS DIP (DEVICE)
Bilirubin Urine: NEGATIVE
Glucose, UA: NEGATIVE
Nitrite: NEGATIVE
Specific Gravity, Urine: <=1.005

## 2014-04-02 ENCOUNTER — Encounter: Payer: Self-pay | Admitting: Cardiovascular Disease

## 2014-04-02 ENCOUNTER — Telehealth: Payer: Self-pay | Admitting: Cardiovascular Disease

## 2014-04-02 NOTE — Telephone Encounter (Signed)
04-02-14 LMM @ 1024 am for pt to call H# rang like a fax, cell # didn't identify her so I didn't say why I was calling but to just give us a call back, pt has defibrillator and was followed by Dr. Royann Shiversroitoru, can't find any recent appts, need to know if she is being followed by someone else, if not needs appt with Dr. Alicia Amelroitoru/mt

## 2017-01-22 ENCOUNTER — Inpatient Hospital Stay
Admission: RE | Admit: 2017-01-22 | Discharge: 2017-02-12 | Disposition: A | Discharge status: Home Health | Payer: Medicare Other | Source: Other Acute Inpatient Hospital | Attending: Internal Medicine | Admitting: Internal Medicine

## 2017-01-22 DIAGNOSIS — L0291 Cutaneous abscess, unspecified: Secondary | ICD-10-CM

## 2017-01-22 DIAGNOSIS — Z452 Encounter for adjustment and management of vascular access device: Secondary | ICD-10-CM

## 2017-01-22 DIAGNOSIS — K56609 Unspecified intestinal obstruction, unspecified as to partial versus complete obstruction: Secondary | ICD-10-CM

## 2017-01-22 DIAGNOSIS — K567 Ileus, unspecified: Secondary | ICD-10-CM

## 2017-01-23 LAB — COMPREHENSIVE METABOLIC PANEL
ALBUMIN: 1.8 g/dL — AB (ref 3.5–5.0)
ALT: 7 U/L — ABNORMAL LOW (ref 14–54)
ANION GAP: 8 (ref 5–15)
AST: 16 U/L (ref 15–41)
Alkaline Phosphatase: 65 U/L (ref 38–126)
BUN: 10 mg/dL (ref 6–20)
CALCIUM: 8 mg/dL — AB (ref 8.9–10.3)
CHLORIDE: 96 mmol/L — AB (ref 101–111)
CO2: 29 mmol/L (ref 22–32)
Creatinine, Ser: 0.52 mg/dL (ref 0.44–1.00)
GFR calc non Af Amer: >60 mL/min (ref >60)
GLUCOSE: 107 mg/dL — AB (ref 65–99)
POTASSIUM: 3.2 mmol/L — AB (ref 3.5–5.1)
SODIUM: 133 mmol/L — AB (ref 135–145)
Total Bilirubin: 0.3 mg/dL (ref 0.3–1.2)
Total Protein: 5.2 g/dL — ABNORMAL LOW (ref 6.5–8.1)

## 2017-01-23 LAB — BLOOD GAS, ARTERIAL
Acid-Base Excess: 6.5 mmol/L — ABNORMAL HIGH (ref 0.0–2.0)
Bicarbonate: 30.2 mmol/L — ABNORMAL HIGH (ref 20.0–28.0)
DRAWN BY: 244851
O2 Content: 1.5 L/min
O2 Saturation: 93.8 %
PH ART: 7.48 — AB (ref 7.350–7.450)
Patient temperature: 98.1
pCO2 arterial: 40.9 mmHg (ref 32.0–48.0)
pO2, Arterial: 70.4 mmHg — ABNORMAL LOW (ref 83.0–108.0)

## 2017-01-23 LAB — CBC WITH DIFFERENTIAL/PLATELET
BASOS PCT: 0 %
Basophils Absolute: 0 10*3/uL (ref 0.0–0.1)
Eosinophils Absolute: 0.2 10*3/uL (ref 0.0–0.7)
Eosinophils Relative: 1 %
HEMATOCRIT: 22.9 % — AB (ref 36.0–46.0)
HEMOGLOBIN: 7.6 g/dL — AB (ref 12.0–15.0)
LYMPHS ABS: 1.3 10*3/uL (ref 0.7–4.0)
Lymphocytes Relative: 10 %
MCH: 31.3 pg (ref 26.0–34.0)
MCHC: 33.2 g/dL (ref 30.0–36.0)
MCV: 94.2 fL (ref 78.0–100.0)
MONOS PCT: 6 %
Monocytes Absolute: 0.8 10*3/uL (ref 0.1–1.0)
NEUTROS ABS: 10.5 10*3/uL — AB (ref 1.7–7.7)
NEUTROS PCT: 83 %
Platelets: 382 10*3/uL (ref 150–400)
RBC: 2.43 MIL/uL — ABNORMAL LOW (ref 3.87–5.11)
RDW: 17.6 % — ABNORMAL HIGH (ref 11.5–15.5)
WBC: 12.8 10*3/uL — ABNORMAL HIGH (ref 4.0–10.5)

## 2017-01-23 LAB — C DIFFICILE QUICK SCREEN W PCR REFLEX
C DIFFICILE (CDIFF) TOXIN: NEGATIVE
C DIFFICLE (CDIFF) ANTIGEN: NEGATIVE
C Diff interpretation: NOT DETECTED

## 2017-01-23 LAB — PROTIME-INR
INR: 1.38
Prothrombin Time: 17.1 seconds — ABNORMAL HIGH (ref 11.4–15.2)

## 2017-01-24 ENCOUNTER — Other Ambulatory Visit (HOSPITAL_COMMUNITY): Payer: Medicare Other

## 2017-01-24 LAB — CBC WITH DIFFERENTIAL/PLATELET
BASOS ABS: 0 10*3/uL (ref 0.0–0.1)
Basophils Relative: 0 %
EOS PCT: 1 %
Eosinophils Absolute: 0.1 10*3/uL (ref 0.0–0.7)
HCT: 25.1 % — ABNORMAL LOW (ref 36.0–46.0)
Hemoglobin: 8.2 g/dL — ABNORMAL LOW (ref 12.0–15.0)
LYMPHS ABS: 1.2 10*3/uL (ref 0.7–4.0)
LYMPHS PCT: 11 %
MCH: 30.9 pg (ref 26.0–34.0)
MCHC: 32.7 g/dL (ref 30.0–36.0)
MCV: 94.7 fL (ref 78.0–100.0)
MONO ABS: 0.7 10*3/uL (ref 0.1–1.0)
Monocytes Relative: 6 %
Neutro Abs: 9.2 10*3/uL — ABNORMAL HIGH (ref 1.7–7.7)
Neutrophils Relative %: 82 %
PLATELETS: 440 10*3/uL — AB (ref 150–400)
RBC: 2.65 MIL/uL — ABNORMAL LOW (ref 3.87–5.11)
RDW: 18 % — AB (ref 11.5–15.5)
WBC: 11.2 10*3/uL — ABNORMAL HIGH (ref 4.0–10.5)

## 2017-01-24 LAB — COMPREHENSIVE METABOLIC PANEL
ALBUMIN: 2.2 g/dL — AB (ref 3.5–5.0)
ALK PHOS: 68 U/L (ref 38–126)
ALK PHOS: 77 U/L (ref 38–126)
ALT: 8 U/L — ABNORMAL LOW (ref 14–54)
ALT: 8 U/L — ABNORMAL LOW (ref 14–54)
ANION GAP: 6 (ref 5–15)
AST: 18 U/L (ref 15–41)
AST: 18 U/L (ref 15–41)
Albumin: 1.9 g/dL — ABNORMAL LOW (ref 3.5–5.0)
Anion gap: 10 (ref 5–15)
BILIRUBIN TOTAL: 0.6 mg/dL (ref 0.3–1.2)
BUN: 12 mg/dL (ref 6–20)
BUN: 12 mg/dL (ref 6–20)
CALCIUM: 8.1 mg/dL — AB (ref 8.9–10.3)
CO2: 26 mmol/L (ref 22–32)
CO2: 26 mmol/L (ref 22–32)
Calcium: 8.4 mg/dL — ABNORMAL LOW (ref 8.9–10.3)
Chloride: 102 mmol/L (ref 101–111)
Chloride: 98 mmol/L — ABNORMAL LOW (ref 101–111)
Creatinine, Ser: 0.53 mg/dL (ref 0.44–1.00)
Creatinine, Ser: 0.56 mg/dL (ref 0.44–1.00)
GFR calc Af Amer: >60 mL/min (ref >60)
GFR calc non Af Amer: >60 mL/min (ref >60)
GLUCOSE: 153 mg/dL — AB (ref 65–99)
Glucose, Bld: 167 mg/dL — ABNORMAL HIGH (ref 65–99)
POTASSIUM: 2.9 mmol/L — AB (ref 3.5–5.1)
Potassium: 3.3 mmol/L — ABNORMAL LOW (ref 3.5–5.1)
Sodium: 134 mmol/L — ABNORMAL LOW (ref 135–145)
Sodium: 134 mmol/L — ABNORMAL LOW (ref 135–145)
TOTAL PROTEIN: 6.1 g/dL — AB (ref 6.5–8.1)
Total Bilirubin: 0.3 mg/dL (ref 0.3–1.2)
Total Protein: 5.5 g/dL — ABNORMAL LOW (ref 6.5–8.1)

## 2017-01-24 LAB — MAGNESIUM
Magnesium: 1.5 mg/dL — ABNORMAL LOW (ref 1.7–2.4)
Magnesium: 1.5 mg/dL — ABNORMAL LOW (ref 1.7–2.4)

## 2017-01-24 LAB — TRIGLYCERIDES
TRIGLYCERIDES: 50 mg/dL (ref <150)
Triglycerides: 39 mg/dL (ref <150)

## 2017-01-24 LAB — PHOSPHORUS
Phosphorus: 2.6 mg/dL (ref 2.5–4.6)
Phosphorus: 2.6 mg/dL (ref 2.5–4.6)

## 2017-01-25 ENCOUNTER — Other Ambulatory Visit (HOSPITAL_COMMUNITY): Payer: Medicare Other

## 2017-01-25 LAB — BASIC METABOLIC PANEL
ANION GAP: 11 (ref 5–15)
BUN: 13 mg/dL (ref 6–20)
CALCIUM: 8.2 mg/dL — AB (ref 8.9–10.3)
CO2: 26 mmol/L (ref 22–32)
Chloride: 98 mmol/L — ABNORMAL LOW (ref 101–111)
Creatinine, Ser: 0.51 mg/dL (ref 0.44–1.00)
GLUCOSE: 145 mg/dL — AB (ref 65–99)
Potassium: 2.8 mmol/L — ABNORMAL LOW (ref 3.5–5.1)
SODIUM: 135 mmol/L (ref 135–145)

## 2017-01-25 LAB — MAGNESIUM: Magnesium: 2 mg/dL (ref 1.7–2.4)

## 2017-01-25 LAB — PHOSPHORUS: Phosphorus: 3.2 mg/dL (ref 2.5–4.6)

## 2017-01-26 LAB — BASIC METABOLIC PANEL
Anion gap: 8 (ref 5–15)
BUN: 17 mg/dL (ref 6–20)
CO2: 26 mmol/L (ref 22–32)
Calcium: 8.3 mg/dL — ABNORMAL LOW (ref 8.9–10.3)
Chloride: 102 mmol/L (ref 101–111)
Creatinine, Ser: 0.47 mg/dL (ref 0.44–1.00)
GFR calc Af Amer: >60 mL/min (ref >60)
Glucose, Bld: 173 mg/dL — ABNORMAL HIGH (ref 65–99)
POTASSIUM: 3.2 mmol/L — AB (ref 3.5–5.1)
Sodium: 136 mmol/L (ref 135–145)

## 2017-01-27 LAB — BASIC METABOLIC PANEL
ANION GAP: 9 (ref 5–15)
BUN: 19 mg/dL (ref 6–20)
CHLORIDE: 98 mmol/L — AB (ref 101–111)
CO2: 27 mmol/L (ref 22–32)
Calcium: 8.4 mg/dL — ABNORMAL LOW (ref 8.9–10.3)
Creatinine, Ser: 0.51 mg/dL (ref 0.44–1.00)
GFR calc Af Amer: >60 mL/min (ref >60)
GLUCOSE: 168 mg/dL — AB (ref 65–99)
POTASSIUM: 3.6 mmol/L (ref 3.5–5.1)
Sodium: 134 mmol/L — ABNORMAL LOW (ref 135–145)

## 2017-01-27 LAB — CULTURE, BLOOD (ROUTINE X 2)
CULTURE: NO GROWTH
CULTURE: NO GROWTH

## 2017-01-28 LAB — BASIC METABOLIC PANEL
ANION GAP: 12 (ref 5–15)
BUN: 19 mg/dL (ref 6–20)
CALCIUM: 8.6 mg/dL — AB (ref 8.9–10.3)
CHLORIDE: 96 mmol/L — AB (ref 101–111)
CO2: 25 mmol/L (ref 22–32)
Creatinine, Ser: 0.55 mg/dL (ref 0.44–1.00)
GFR calc Af Amer: >60 mL/min (ref >60)
GFR calc non Af Amer: >60 mL/min (ref >60)
GLUCOSE: 152 mg/dL — AB (ref 65–99)
Potassium: 3.5 mmol/L (ref 3.5–5.1)
Sodium: 133 mmol/L — ABNORMAL LOW (ref 135–145)

## 2017-01-31 LAB — CBC WITH DIFFERENTIAL/PLATELET
BASOS PCT: 1 %
Basophils Absolute: 0.1 10*3/uL (ref 0.0–0.1)
Eosinophils Absolute: 0.2 10*3/uL (ref 0.0–0.7)
Eosinophils Relative: 2 %
HEMATOCRIT: 31.6 % — AB (ref 36.0–46.0)
HEMOGLOBIN: 10.2 g/dL — AB (ref 12.0–15.0)
LYMPHS ABS: 1.9 10*3/uL (ref 0.7–4.0)
LYMPHS PCT: 20 %
MCH: 31 pg (ref 26.0–34.0)
MCHC: 32.3 g/dL (ref 30.0–36.0)
MCV: 96 fL (ref 78.0–100.0)
MONOS PCT: 9 %
Monocytes Absolute: 0.9 10*3/uL (ref 0.1–1.0)
NEUTROS ABS: 6.3 10*3/uL (ref 1.7–7.7)
NEUTROS PCT: 68 %
Platelets: 341 10*3/uL (ref 150–400)
RBC: 3.29 MIL/uL — ABNORMAL LOW (ref 3.87–5.11)
RDW: 16.5 % — ABNORMAL HIGH (ref 11.5–15.5)
WBC: 9.4 10*3/uL (ref 4.0–10.5)

## 2017-01-31 LAB — COMPREHENSIVE METABOLIC PANEL
ALBUMIN: 2.3 g/dL — AB (ref 3.5–5.0)
ALT: 7 U/L — AB (ref 14–54)
AST: 16 U/L (ref 15–41)
Alkaline Phosphatase: 83 U/L (ref 38–126)
Anion gap: 8 (ref 5–15)
BUN: 18 mg/dL (ref 6–20)
CHLORIDE: 97 mmol/L — AB (ref 101–111)
CO2: 27 mmol/L (ref 22–32)
CREATININE: 0.55 mg/dL (ref 0.44–1.00)
Calcium: 8.9 mg/dL (ref 8.9–10.3)
GFR calc Af Amer: >60 mL/min (ref >60)
GFR calc non Af Amer: >60 mL/min (ref >60)
GLUCOSE: 158 mg/dL — AB (ref 65–99)
POTASSIUM: 4.7 mmol/L (ref 3.5–5.1)
SODIUM: 132 mmol/L — AB (ref 135–145)
Total Bilirubin: 0.6 mg/dL (ref 0.3–1.2)
Total Protein: 6.6 g/dL (ref 6.5–8.1)

## 2017-01-31 LAB — MAGNESIUM: MAGNESIUM: 1.7 mg/dL (ref 1.7–2.4)

## 2017-01-31 LAB — TRIGLYCERIDES: Triglycerides: 43 mg/dL (ref <150)

## 2017-01-31 LAB — PHOSPHORUS: Phosphorus: 3.8 mg/dL (ref 2.5–4.6)

## 2017-02-01 LAB — BASIC METABOLIC PANEL
Anion gap: 6 (ref 5–15)
BUN: 20 mg/dL (ref 6–20)
CALCIUM: 9.2 mg/dL (ref 8.9–10.3)
CHLORIDE: 100 mmol/L — AB (ref 101–111)
CO2: 25 mmol/L (ref 22–32)
CREATININE: 0.54 mg/dL (ref 0.44–1.00)
GFR calc non Af Amer: >60 mL/min (ref >60)
GLUCOSE: 171 mg/dL — AB (ref 65–99)
Potassium: 4.9 mmol/L (ref 3.5–5.1)
Sodium: 131 mmol/L — ABNORMAL LOW (ref 135–145)

## 2017-02-02 LAB — BASIC METABOLIC PANEL
ANION GAP: 10 (ref 5–15)
BUN: 23 mg/dL — ABNORMAL HIGH (ref 6–20)
CHLORIDE: 94 mmol/L — AB (ref 101–111)
CO2: 27 mmol/L (ref 22–32)
CREATININE: 0.55 mg/dL (ref 0.44–1.00)
Calcium: 9.3 mg/dL (ref 8.9–10.3)
GFR calc non Af Amer: >60 mL/min (ref >60)
Glucose, Bld: 170 mg/dL — ABNORMAL HIGH (ref 65–99)
POTASSIUM: 4.8 mmol/L (ref 3.5–5.1)
SODIUM: 131 mmol/L — AB (ref 135–145)

## 2017-02-03 LAB — CBC WITH DIFFERENTIAL/PLATELET
BLASTS: 0 %
Band Neutrophils: 0 %
Basophils Absolute: 0.2 10*3/uL — ABNORMAL HIGH (ref 0.0–0.1)
Basophils Relative: 2 %
EOS PCT: 0 %
Eosinophils Absolute: 0 10*3/uL (ref 0.0–0.7)
HEMATOCRIT: 41.3 % (ref 36.0–46.0)
Hemoglobin: 13.8 g/dL (ref 12.0–15.0)
LYMPHS ABS: 1.7 10*3/uL (ref 0.7–4.0)
LYMPHS PCT: 15 %
MCH: 32 pg (ref 26.0–34.0)
MCHC: 33.4 g/dL (ref 30.0–36.0)
MCV: 95.8 fL (ref 78.0–100.0)
MONOS PCT: 6 %
Metamyelocytes Relative: 0 %
Monocytes Absolute: 0.7 10*3/uL (ref 0.1–1.0)
Myelocytes: 0 %
NEUTROS ABS: 8.6 10*3/uL — AB (ref 1.7–7.7)
NEUTROS PCT: 77 %
NRBC: 0 /100{WBCs}
OTHER: 0 %
PLATELETS: ADEQUATE 10*3/uL (ref 150–400)
Promyelocytes Absolute: 0 %
RBC: 4.31 MIL/uL (ref 3.87–5.11)
RDW: 15.9 % — AB (ref 11.5–15.5)
Smear Review: ADEQUATE
WBC: 11.2 10*3/uL — AB (ref 4.0–10.5)

## 2017-02-04 LAB — MAGNESIUM: MAGNESIUM: 1.7 mg/dL (ref 1.7–2.4)

## 2017-02-04 LAB — BASIC METABOLIC PANEL
ANION GAP: 8 (ref 5–15)
BUN: 25 mg/dL — ABNORMAL HIGH (ref 6–20)
CALCIUM: 9.3 mg/dL (ref 8.9–10.3)
CO2: 27 mmol/L (ref 22–32)
Chloride: 94 mmol/L — ABNORMAL LOW (ref 101–111)
Creatinine, Ser: 0.65 mg/dL (ref 0.44–1.00)
GFR calc non Af Amer: >60 mL/min (ref >60)
Glucose, Bld: 170 mg/dL — ABNORMAL HIGH (ref 65–99)
Potassium: 4.6 mmol/L (ref 3.5–5.1)
Sodium: 129 mmol/L — ABNORMAL LOW (ref 135–145)

## 2017-02-04 LAB — PHOSPHORUS: PHOSPHORUS: 4.4 mg/dL (ref 2.5–4.6)

## 2017-02-07 ENCOUNTER — Other Ambulatory Visit (HOSPITAL_COMMUNITY): Payer: Medicare Other

## 2017-02-07 LAB — COMPREHENSIVE METABOLIC PANEL WITH GFR
ALT: 108 U/L — ABNORMAL HIGH (ref 14–54)
AST: 127 U/L — ABNORMAL HIGH (ref 15–41)
Albumin: 2.7 g/dL — ABNORMAL LOW (ref 3.5–5.0)
Alkaline Phosphatase: 200 U/L — ABNORMAL HIGH (ref 38–126)
Anion gap: 12 (ref 5–15)
BUN: 30 mg/dL — ABNORMAL HIGH (ref 6–20)
CO2: 31 mmol/L (ref 22–32)
Calcium: 9.6 mg/dL (ref 8.9–10.3)
Chloride: 84 mmol/L — ABNORMAL LOW (ref 101–111)
Creatinine, Ser: 0.88 mg/dL (ref 0.44–1.00)
GFR calc Af Amer: >60 mL/min
GFR calc non Af Amer: >60 mL/min
Glucose, Bld: 136 mg/dL — ABNORMAL HIGH (ref 65–99)
Potassium: 3.8 mmol/L (ref 3.5–5.1)
Sodium: 127 mmol/L — ABNORMAL LOW (ref 135–145)
Total Bilirubin: 0.9 mg/dL (ref 0.3–1.2)
Total Protein: 7.8 g/dL (ref 6.5–8.1)

## 2017-02-07 LAB — MAGNESIUM: Magnesium: 1.7 mg/dL (ref 1.7–2.4)

## 2017-02-07 LAB — VITAMIN B12: Vitamin B-12: 626 pg/mL (ref 180–914)

## 2017-02-07 LAB — TRIGLYCERIDES: Triglycerides: 107 mg/dL

## 2017-02-07 LAB — PHOSPHORUS: Phosphorus: 4.4 mg/dL (ref 2.5–4.6)

## 2017-02-09 LAB — BASIC METABOLIC PANEL
ANION GAP: 11 (ref 5–15)
BUN: 29 mg/dL — ABNORMAL HIGH (ref 6–20)
CALCIUM: 9.4 mg/dL (ref 8.9–10.3)
CO2: 32 mmol/L (ref 22–32)
Chloride: 84 mmol/L — ABNORMAL LOW (ref 101–111)
Creatinine, Ser: 0.67 mg/dL (ref 0.44–1.00)
GFR calc Af Amer: >60 mL/min (ref >60)
GLUCOSE: 153 mg/dL — AB (ref 65–99)
Potassium: 3.5 mmol/L (ref 3.5–5.1)
SODIUM: 127 mmol/L — AB (ref 135–145)

## 2017-02-11 LAB — BASIC METABOLIC PANEL
ANION GAP: 10 (ref 5–15)
BUN: 37 mg/dL — ABNORMAL HIGH (ref 6–20)
CALCIUM: 9.5 mg/dL (ref 8.9–10.3)
CO2: 34 mmol/L — ABNORMAL HIGH (ref 22–32)
Chloride: 82 mmol/L — ABNORMAL LOW (ref 101–111)
Creatinine, Ser: 0.77 mg/dL (ref 0.44–1.00)
Glucose, Bld: 159 mg/dL — ABNORMAL HIGH (ref 65–99)
Potassium: 3.7 mmol/L (ref 3.5–5.1)
SODIUM: 126 mmol/L — AB (ref 135–145)

## 2017-03-20 DEATH — deceased

## 2017-11-23 IMAGING — CT CT ABD-PELV W/O CM
2 of 4 series · 16 of 46 positions shown, 18 images · non-contrast
Comparison: 01/24/2017 abdomen radiograph

CLINICAL DATA: 60 y/o  F; small bowel obstruction.

EXAM:
CT ABDOMEN AND PELVIS WITHOUT CONTRAST
TECHNIQUE: Multidetector CT imaging of the abdomen and pelvis was performed
following the standard protocol without IV contrast.

[Series 2: a/p w/o 5mm · axial · non-contrast · 0.77mm/px · z∈[+728,+1078]mm · 13 of 78 slices shown, 15 images]
[im 4/78  soft-tissue]
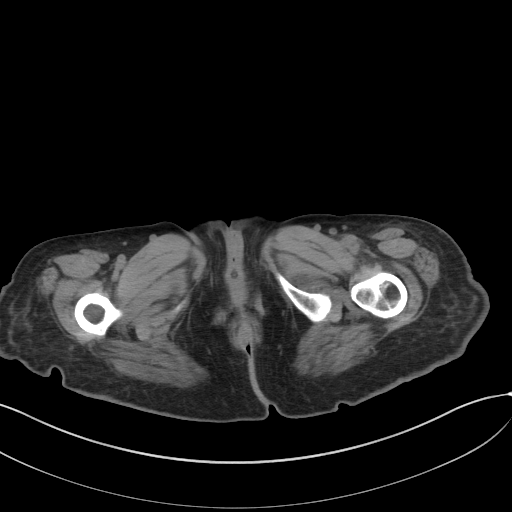
[im 4/78  bone]
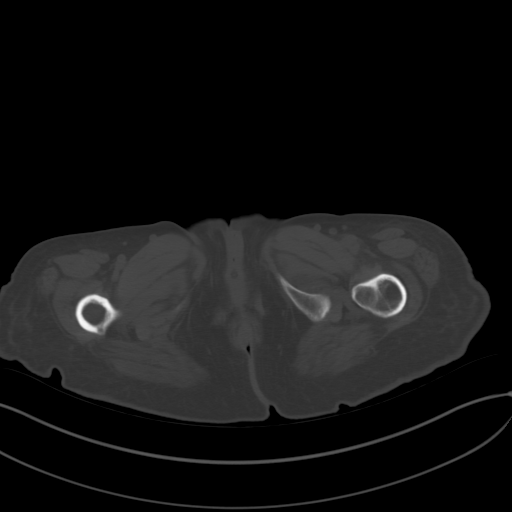
[im 10/78  soft-tissue]
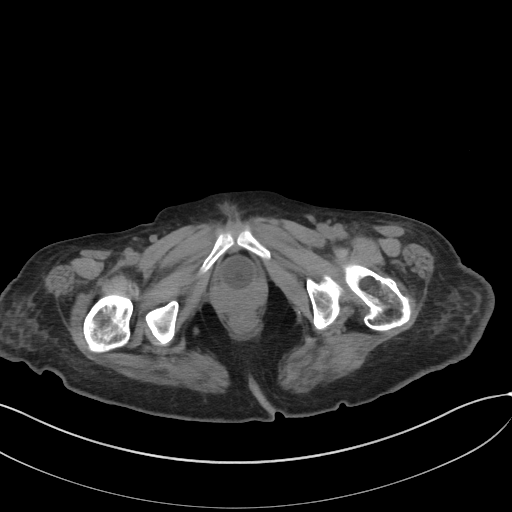
[im 17/78  soft-tissue]
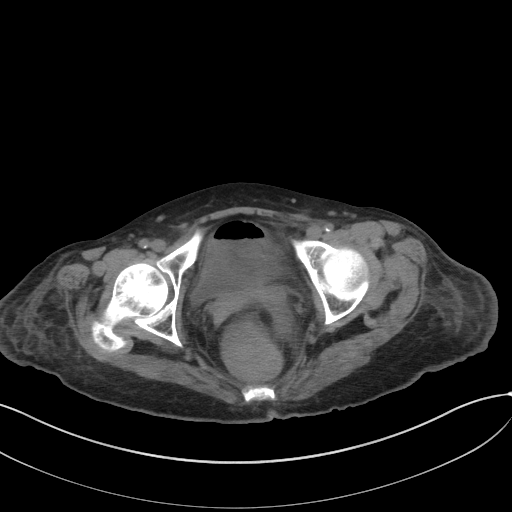
[im 23/78  soft-tissue]
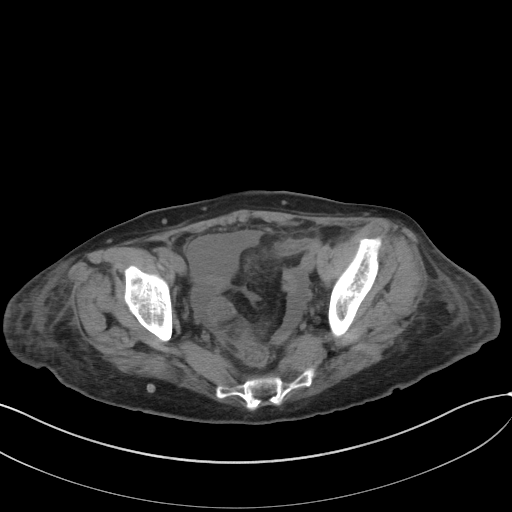
[im 26/78  soft-tissue]
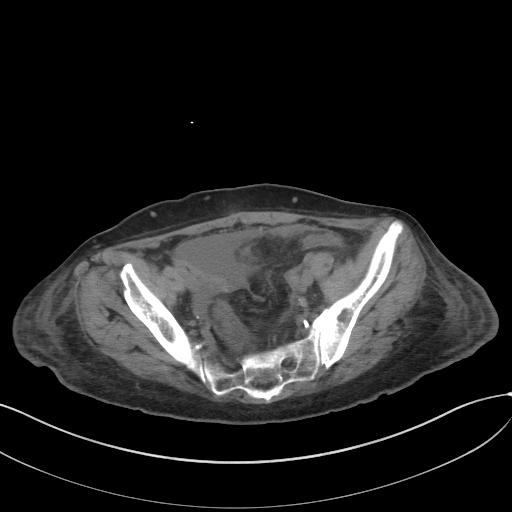
[im 33/78  soft-tissue]
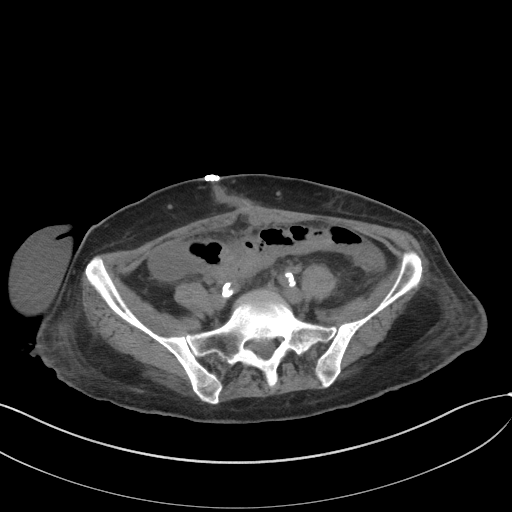
[im 39/78  soft-tissue]
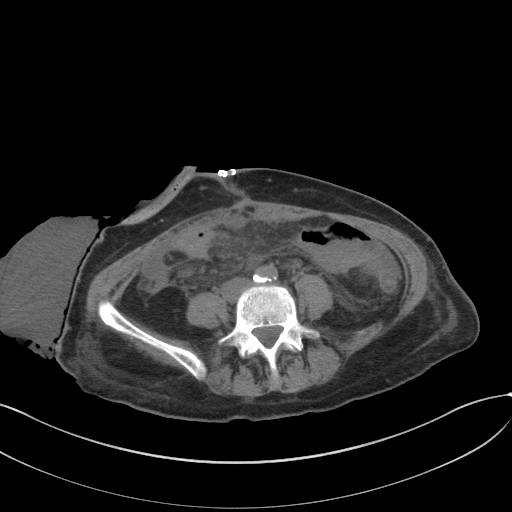
[im 45/78  soft-tissue]
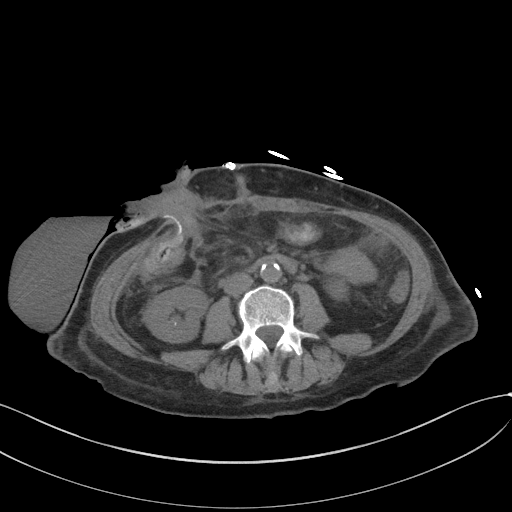
[im 52/78  soft-tissue]
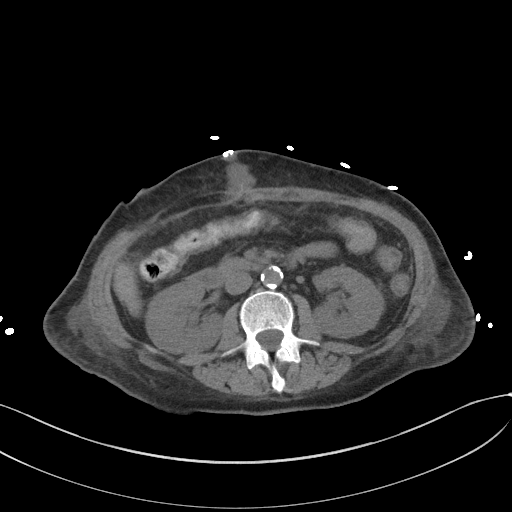
[im 52/78  bone]
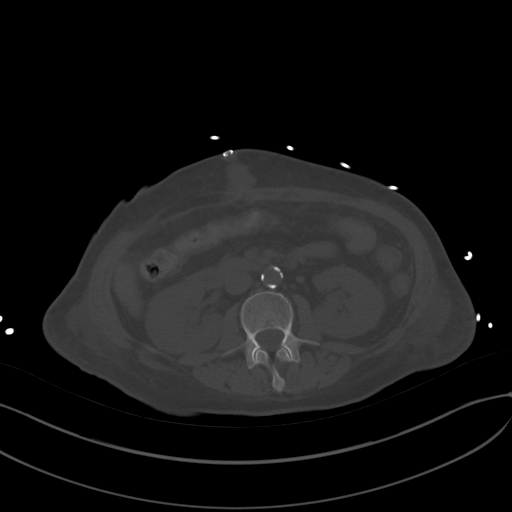
[im 55/78  soft-tissue]
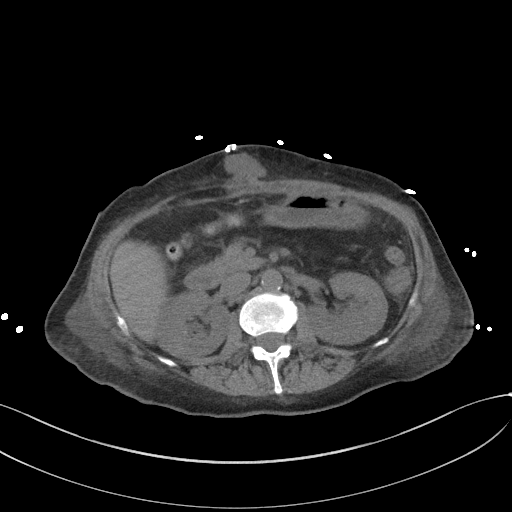
[im 61/78  soft-tissue]
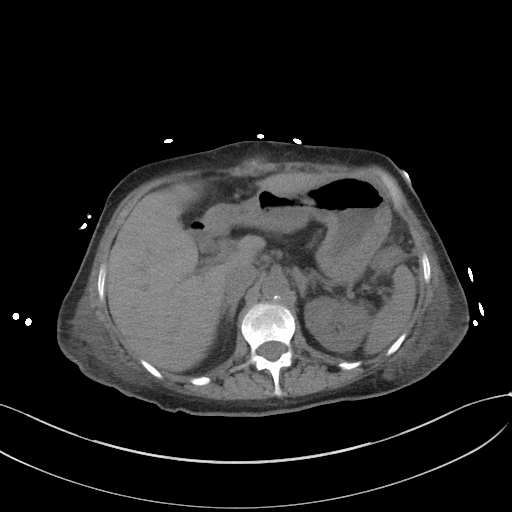
[im 68/78  soft-tissue]
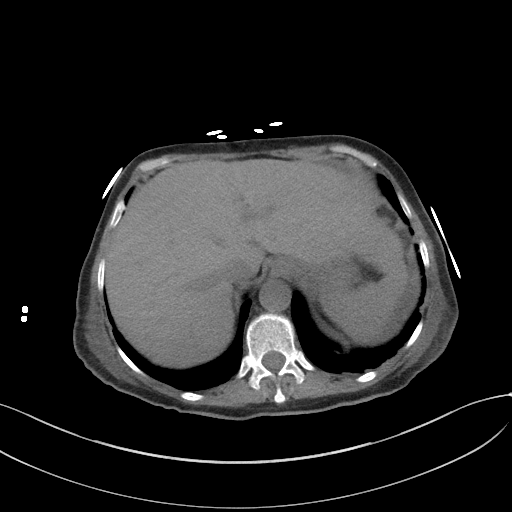
[im 74/78  soft-tissue]
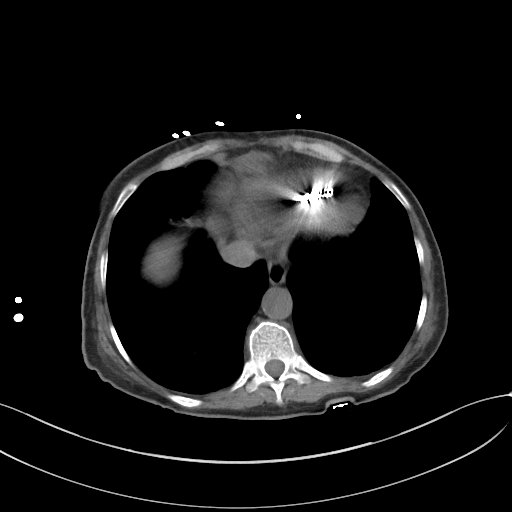

[Series 5: a/p w/o cor · coronal · non-contrast · 0.74mm/px · 3 of 134 slices shown]
[im 45/134  soft-tissue]
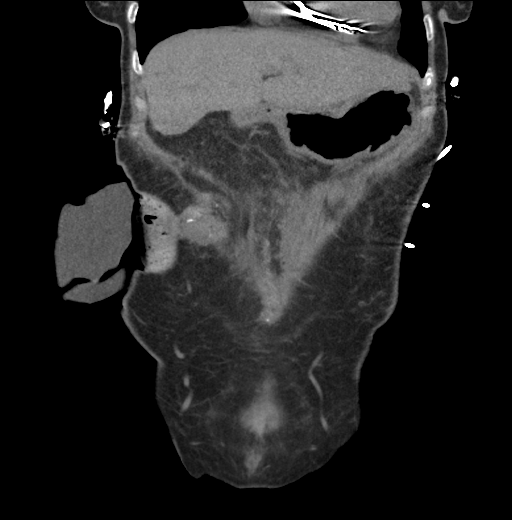
[im 60/134  soft-tissue]
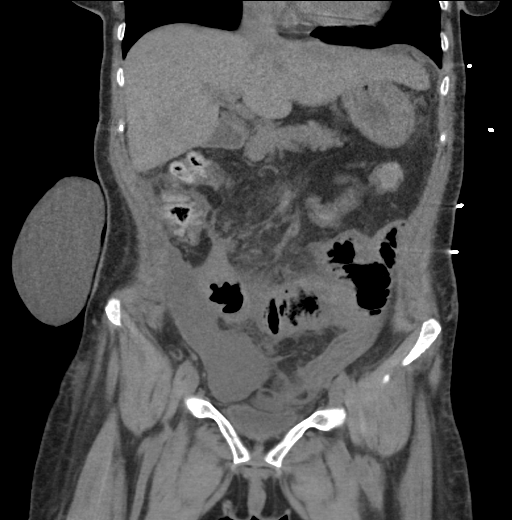
[im 74/134  soft-tissue]
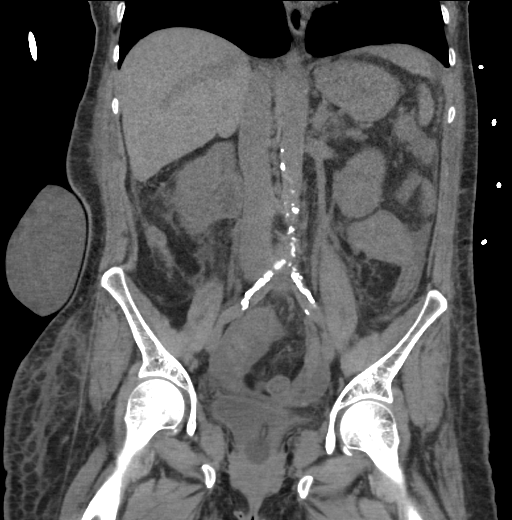

[16 of 46 positions shown; findings below may reference images not displayed]

FINDINGS: Lower chest: Partially visualized AICD lead.

Hepatobiliary: No focal liver abnormality is seen. Status post
cholecystectomy. No biliary dilatation.

Pancreas: Unremarkable. No pancreatic ductal dilatation or
surrounding inflammatory changes.

Spleen: Normal in size without focal abnormality.

Adrenals/Urinary Tract: Bladder collapsed around the Foley catheter.
Multiple nonobstructing punctate urinary stones bilaterally. No
hydronephrosis. There are subcentimeter lucent and hyperdense foci
within the kidneys bilaterally probably representing a combination
of simple and hemorrhagic cysts.

Stomach/Bowel: Partial colectomy and small bowel resection with
anastomosis near right lower quadrant ostomy. Small bowel proximal
to the ostomy is moderately dilated with severe thickening of the
walls and extensive surrounding inflammatory changes within the
mesenteric. There is a small volume of peritoneal ascites.

Vascular/Lymphatic: Aortic atherosclerosis. No enlarged abdominal or
pelvic lymph nodes.

Reproductive: Status post hysterectomy. No adnexal masses.

Other: There is a ventral midline abdominal surgical incision with
skin staples. Fluid within the anterior abdominal wall along the
incision probably represents postoperative seroma.

Musculoskeletal: Diffuse stranding of subcutaneous fat is likely to
represent third spacing. No acute osseous abnormality is identified.
IMPRESSION: 1. Partial colectomy and small bowel resection with anastomosis near
right lower quadrant ostomy. Small bowel proximal to the ostomy is
moderately dilated with nonspecific severe thickening of the walls
and surrounding inflammatory changes within the mesenteric fat which
may be due to infection, postoperative changes, or ischemia.
Suboptimal evaluation of soft tissues in the absence of intravenous
contrast.
2. Small volume of ascites.
3. Nonobstructing kidney stones bilaterally.
4. Aortic atherosclerosis.
5. Midline ventral abdominal incision with pockets of fluid within
the subcutaneous fat, possibly representing postoperative seroma.

By: Tiarra Roush M.D.

## 2017-12-06 IMAGING — CR DG CHEST 1V PORT
1 series · 1 of 1 positions shown · non-contrast
Comparison: December 08, 2010

CLINICAL DATA: Central catheter placement

EXAM:
PORTABLE CHEST 1 VIEW

[AP]
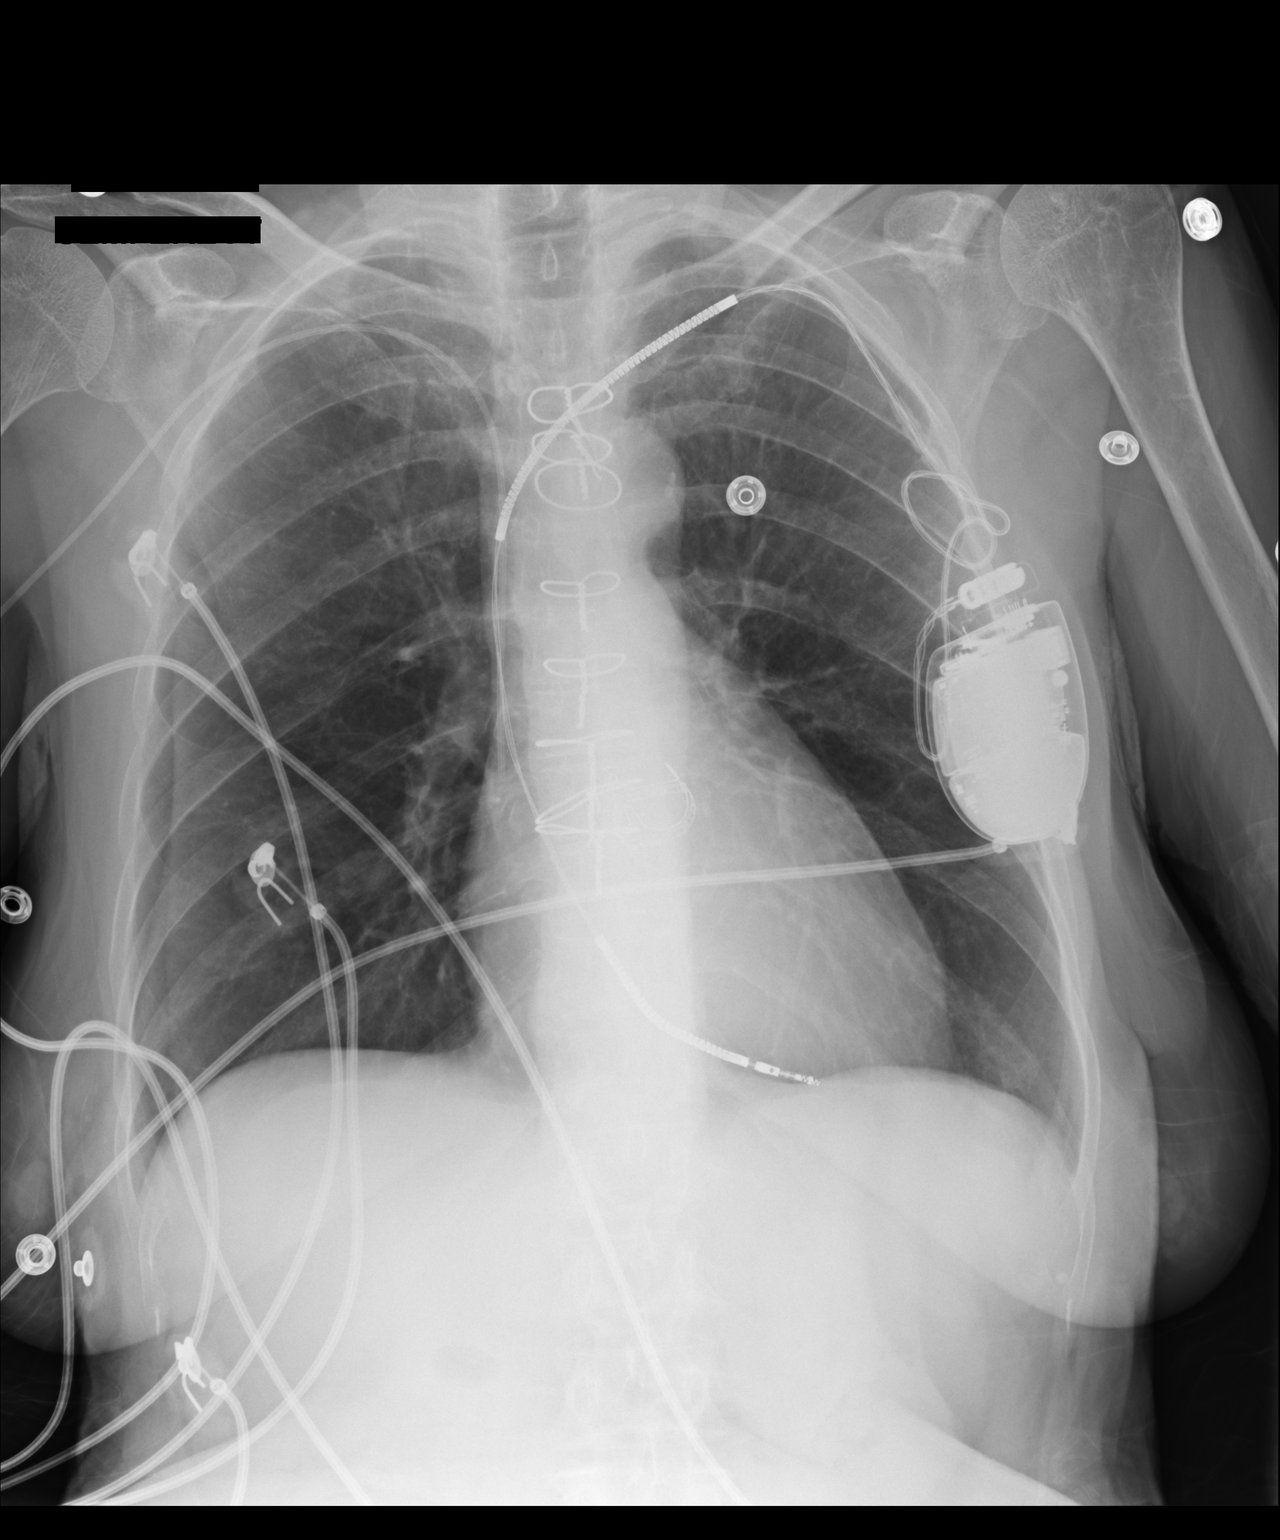

[1 of 1 positions shown; findings below may reference images not displayed]

FINDINGS: Central catheter tip is in the superior vena cava. No pneumothorax.
There is an apparent skin fold on the right. Pacemaker lead tip is
attached to the right ventricle. There is a prosthetic aortic valve.
There is no edema or consolidation. The heart size and pulmonary
vascularity are normal. No adenopathy. There is atherosclerotic
calcification in the aortic arch. No bone lesions.
IMPRESSION: Central catheter tip in superior vena cava. No evident pneumothorax.
There is a skin fold on the right. No edema or consolidation. Aortic
atherosclerosis.
